# Patient Record
Sex: Male | Born: 1985 | Race: Black or African American | Hispanic: No | Marital: Married | Smoking: Never smoker
Health system: Southern US, Community
[De-identification: ages and names within clinical notes are randomized; demographics above are authoritative.]

## PROBLEM LIST (undated history)

## (undated) DIAGNOSIS — C801 Malignant (primary) neoplasm, unspecified: Secondary | ICD-10-CM

## (undated) DIAGNOSIS — E119 Type 2 diabetes mellitus without complications: Secondary | ICD-10-CM

## (undated) HISTORY — PX: APPENDECTOMY: SHX54

## (undated) HISTORY — PX: TONSILLECTOMY: SUR1361

## (undated) HISTORY — PX: CHOLECYSTECTOMY: SHX55

---

## 2014-09-26 ENCOUNTER — Emergency Department (HOSPITAL_COMMUNITY): Payer: BC Managed Care – PPO

## 2014-09-26 ENCOUNTER — Emergency Department (HOSPITAL_COMMUNITY)
Admission: EM | Admit: 2014-09-26 | Discharge: 2014-09-26 | Disposition: A | Discharge status: Home / Self Care | Payer: BC Managed Care – PPO | Attending: Emergency Medicine | Admitting: Emergency Medicine

## 2014-09-26 ENCOUNTER — Encounter (HOSPITAL_COMMUNITY): Payer: Self-pay | Admitting: Emergency Medicine

## 2014-09-26 DIAGNOSIS — D179 Benign lipomatous neoplasm, unspecified: Secondary | ICD-10-CM | POA: Diagnosis not present

## 2014-09-26 DIAGNOSIS — R2242 Localized swelling, mass and lump, left lower limb: Secondary | ICD-10-CM | POA: Diagnosis not present

## 2014-09-26 DIAGNOSIS — R609 Edema, unspecified: Secondary | ICD-10-CM

## 2014-09-26 LAB — BASIC METABOLIC PANEL
ANION GAP: 9 (ref 5–15)
BUN: 13 mg/dL (ref 6–23)
CALCIUM: 9.4 mg/dL (ref 8.4–10.5)
CO2: 27 mEq/L (ref 19–32)
Chloride: 104 mEq/L (ref 96–112)
Creatinine, Ser: 1.04 mg/dL (ref 0.50–1.35)
GFR calc Af Amer: >90 mL/min (ref >90)
GFR calc non Af Amer: >90 mL/min (ref >90)
GLUCOSE: 100 mg/dL — AB (ref 70–99)
Potassium: 4.2 mEq/L (ref 3.7–5.3)
Sodium: 140 mEq/L (ref 137–147)

## 2014-09-26 LAB — CBC
HCT: 44.2 % (ref 39.0–52.0)
Hemoglobin: 15.3 g/dL (ref 13.0–17.0)
MCH: 28.3 pg (ref 26.0–34.0)
MCHC: 34.6 g/dL (ref 30.0–36.0)
MCV: 81.7 fL (ref 78.0–100.0)
PLATELETS: ADEQUATE 10*3/uL (ref 150–400)
RBC: 5.41 MIL/uL (ref 4.22–5.81)
RDW: 13.1 % (ref 11.5–15.5)
WBC: 3.6 10*3/uL — ABNORMAL LOW (ref 4.0–10.5)

## 2014-09-26 MED ORDER — IOHEXOL 300 MG/ML  SOLN
100.0000 mL | Freq: Once | INTRAMUSCULAR | Status: AC | PRN
  Administered 2014-09-26: 100 mL via INTRAVENOUS

## 2014-09-26 MED ORDER — OXYCODONE-ACETAMINOPHEN 5-325 MG PO TABS
1.0000 | ORAL_TABLET | Freq: Once | ORAL | Status: AC
  Administered 2014-09-26: 1 via ORAL
  Filled 2014-09-26: qty 1

## 2014-09-26 MED ORDER — OXYCODONE-ACETAMINOPHEN 5-325 MG PO TABS: 1.0000 | ORAL_TABLET | Freq: Three times a day (TID) | ORAL | Status: DC | PRN

## 2014-09-26 NOTE — Discharge Instructions (Signed)
Lipoma  A lipoma is a noncancerous (benign) tumor composed of fat cells. They are usually found under the skin (subcutaneous). A lipoma may occur in any tissue of the body that contains fat. Common areas for lipomas to appear include the back, shoulders, buttocks, and thighs. Lipomas are a very common soft tissue growth. They are soft and grow slowly. Most problems caused by a lipoma depend on where it is growing.  DIAGNOSIS   A lipoma can be diagnosed with a physical exam. These tumors rarely become cancerous, but radiographic studies can help determine this for certain. Studies used may include:  · Computerized X-ray scans (CT or CAT scan).  · Computerized magnetic scans (MRI).  TREATMENT   Small lipomas that are not causing problems may be watched. If a lipoma continues to enlarge or causes problems, removal is often the best treatment. Lipomas can also be removed to improve appearance. Surgery is done to remove the fatty cells and the surrounding capsule. Most often, this is done with medicine that numbs the area (local anesthetic). The removed tissue is examined under a microscope to make sure it is not cancerous. Keep all follow-up appointments with your caregiver.  SEEK MEDICAL CARE IF:   · The lipoma becomes larger or hard.  · The lipoma becomes painful, red, or increasingly swollen. These could be signs of infection or a more serious condition.  Document Released: 10/03/2002 Document Revised: 01/05/2012 Document Reviewed: 03/15/2010  ExitCare® Patient Information ©2015 [employer]. This information is not intended to replace advice given to you by your health care provider. Make sure you discuss any questions you have with your health care provider.

## 2014-09-26 NOTE — ED Provider Notes (Signed)
CSN: [id number]     Arrival date & time 09/26/14  7062 History   First MD Initiated Contact with Patient 09/26/14 [phone]     Chief Complaint  Patient presents with  . Abscess     (Consider location/radiation/quality/duration/timing/severity/associated sxs/prior Treatment) HPI Comments: Pt comes in with complaint of increased swelling to his left upper thigh that has been going on for 4 months. Denies redness or warmth. States that the area is getting bigger and now it hurts to walk. States that he was seen at [facility] a couple of months ago but her can't remember what they told him it was.   The history is provided by the patient. No language interpreter was used.    History reviewed. No pertinent past medical history. Past Surgical History  Procedure Laterality Date  . Cholecystectomy    . Appendectomy     History reviewed. No pertinent family history. History  Substance Use Topics  . Smoking status: Never Smoker   . Smokeless tobacco: Not on file  . Alcohol Use: No    Review of Systems  All other systems reviewed and are negative.     Allergies  Review of patient's allergies indicates no known allergies.  Home Medications   Prior to Admission medications   Not on File   BP 136/93 mmHg  Pulse 60  Temp(Src) 97.9 F (36.6 C) (Oral)  Resp 18  Ht 5\' 11"  (1.803 m)  Wt 244 lb (110.678 kg)  BMI 34.05 kg/m2  SpO2 100% Physical Exam  Constitutional: He is oriented to person, place, and time. He appears well-developed and well-nourished.  Cardiovascular: Normal rate and regular rhythm.   Pulmonary/Chest: Effort normal and breath sounds normal.  Musculoskeletal:  Larger area of fullness noted to the left upper thigh. Pt has full rom:no lymphadenopathy noted.   Neurological: He is alert and oriented to person, place, and time.  Skin: Skin is warm and dry.  Nursing note and vitals reviewed.   ED Course  Procedures (including critical care time) Labs Review Labs  Reviewed - No data to display  Imaging Review Dg Hip Complete Left  09/26/2014   CLINICAL DATA:  Left hip swelling for about 4 months, pain, no known injury  EXAM: LEFT HIP - COMPLETE 2+ VIEW  COMPARISON:  None.  FINDINGS: Three views of the left hip submitted. No acute fracture or subluxation. Bilateral hip joints are symmetrical in appearance. Bilateral SI joints are unremarkable.  IMPRESSION: Negative.   Electronically Signed   By: Lahoma Crocker M.D.   On: 09/26/2014 09:48   Ct Hip Left W Contrast  09/26/2014   CLINICAL DATA:  Focal swelling in the left hip starting 4 months ago, enlarging and painful.  EXAM: CT OF THE LEFT HIP WITH CONTRAST  TECHNIQUE: Multidetector CT imaging was performed following the standard protocol during bolus administration of intravenous contrast.  CONTRAST:  113mL OMNIPAQUE IOHEXOL 300 MG/ML  SOLN  COMPARISON:  09/26/2014  FINDINGS: Fatty mass of the left tensor fascia lata muscle, 6.3 by 4.0 by 10.1 cm, with faint stranding in the inferior component of the mass. The adjacent tensor fascia lata demonstrates low-level stranding adjacent to its gluteal margin.  No regional fluid collection identified. Small left inguinal lymph nodes are present. The No pathologic regional adenopathy. Mild degenerative findings/erosions along the iliac side of the left sacroiliac joint.  IMPRESSION: 1. The dominant finding is a lipoma measuring up to 10.1 cm in length in the tensor fascia lata muscle on the  left. This has some faint stranding inferiorly which is probably inflammatory. Given the patient' s age the possibility of liposarcoma is considered very low, and there is no overt were some nodularity in this lesion. If it is causing symptoms than resection may be warranted. 2. Slight thickening of the left tensor fascia lata posteriorly. 3. Several small erosions or degenerative subcortical cystic lesions along the iliac side of the left sacroiliac joint. These are best appreciated on image 62  of series 4.   Electronically Signed   By: Sherryl Barters M.D.   On: 09/26/2014 11:12     EKG Interpretation None      MDM   Final diagnoses:  Swelling  Lipoma    Discussed findings with pt. Pt given percocet for pain. Given referral for ortho and general surgery for possible removal    Glendell Docker, NP 09/26/14 [place], MD 09/26/14 [phone]

## 2014-09-26 NOTE — ED Notes (Signed)
Pt reports "abscess" to left upper thigh x3-4 months. Pt reports site has gotten progressively bigger recently. nad noted.

## 2014-10-05 ENCOUNTER — Encounter: Payer: Self-pay | Admitting: Orthopedic Surgery

## 2014-10-05 ENCOUNTER — Ambulatory Visit: Payer: BC Managed Care – PPO | Admitting: Orthopedic Surgery

## 2014-10-10 ENCOUNTER — Telehealth: Payer: Self-pay | Admitting: Orthopedic Surgery

## 2014-10-10 NOTE — Telephone Encounter (Signed)
TOMORROW

## 2014-10-10 NOTE — Telephone Encounter (Signed)
Patient has called back to re-schedule missed appointment on 10/05/14, [facility] follow up, for problem of Lipoma, Left hip; Xray had been done 09/26/14 at [facility].  Patient relates that size has increased.  Please review and advise regarding scheduling, based on current schedule.  His ph# [phone]

## 2014-10-10 NOTE — Telephone Encounter (Signed)
Called back to patient and scheduled for tomorrow per Dr Ruthe Mannan reply.

## 2014-10-11 ENCOUNTER — Telehealth: Payer: Self-pay | Admitting: Orthopedic Surgery

## 2014-10-11 ENCOUNTER — Encounter: Payer: Self-pay | Admitting: Orthopedic Surgery

## 2014-10-11 ENCOUNTER — Ambulatory Visit (INDEPENDENT_AMBULATORY_CARE_PROVIDER_SITE_OTHER): Payer: BC Managed Care – PPO | Admitting: Orthopedic Surgery

## 2014-10-11 VITALS — BP 115/74 | Ht 71.0 in | Wt 244.0 lb

## 2014-10-11 DIAGNOSIS — D1720 Benign lipomatous neoplasm of skin and subcutaneous tissue of unspecified limb: Secondary | ICD-10-CM | POA: Insufficient documentation

## 2014-10-11 NOTE — Patient Instructions (Signed)
Surgery 10/13/14 mass removal left hip

## 2014-10-11 NOTE — Progress Notes (Signed)
Patient ID: Jason Stone, male   DOB: 04/22/1986, 28 y.o.   MRN: [id number]  Chief Complaint  Patient presents with  . Hip Problem    er follow up, left hip pain with lipoma, no known injury    HPI Jason Stone is a 28 y.o. male.  HPI this patient gives a strong family history of multiple members having cancer including a 30-year-old brother who was recently treated for brain cancer and passed away also has other first-degree relatives with cancer history. He had a cholecystectomy for gallstone disease and appendectomy in August 2015 was in his normal state of health and then started having pain over a mass in the left anterior lateral thigh which was benign and asymptomatic initially and found on incidental CAT scan for other abdominal problems which have since resolved. However the mass approximately 2 months goes about 5 cm and then became painful and upon further CT scan evaluation had grown to 10 cm. It is now interfering with his work as a. He now complains of sharp dull throbbing pain radiating in his upper thigh with associated tingling in the anterolateral nerve. The pain is constant elevated by walking.  No past medical history on file.  Past Surgical History  Procedure Laterality Date  . Cholecystectomy    . Appendectomy      Family History  Problem Relation Age of Onset  . Cancer - Lung    . Cancer - Other      Social History History  Substance Use Topics  . Smoking status: Never Smoker   . Smokeless tobacco: Not on file  . Alcohol Use: No    No Known Allergies  Current Outpatient Prescriptions  Medication Sig Dispense Refill  . acetaminophen (TYLENOL) 500 MG tablet Take 500 mg by mouth every 6 (six) hours as needed.    . oxyCODONE-acetaminophen (PERCOCET/ROXICET) 5-325 MG per tablet Take 1-2 tablets by mouth every 8 (eight) hours as needed for moderate pain or severe pain. (Patient not taking: Reported on 10/11/2014) 10 tablet 0   No current  facility-administered medications for this visit.    Review of Systems Review of Systems All systems are negative except for the left thigh pain which she describes as joint pain but is really related to the mass Blood pressure 115/74, height 5' 11" (1.803 m), weight 244 lb (110.678 kg).  Physical Exam Physical Exam BP 115/74 mmHg  Ht 5' 11" (1.803 m)  Wt 244 lb (110.678 kg)  BMI 34.05 kg/m2 This gentleman is well-developed and well-nourished grooming is normal his hygiene is good he is oriented 3 his mood is pleasant his affect is flat he is ambulating with a limp favoring his left leg  He has a palpable mass which measures 10 x 6 cm and is tender appears to have a rubbery consistency. It does not affect his hip range of motion his hip remained stable muscle tone is normal no atrophy is noted no lymph nodes are palpable in the groin his skin is intact without redness he has a good distal pulse and normal reflexes. No sensory abnormalities are detected  Right leg is normal  Upper extremities are normal   Data Reviewed X-rays are negative and CT scan results are as follows  TECHNIQUE: Multidetector CT imaging was performed following the standard protocol during bolus administration of intravenous contrast.   CONTRAST:  100mL OMNIPAQUE IOHEXOL 300 MG/ML  SOLN   COMPARISON:  09/26/2014   FINDINGS: Fatty mass of the left tensor fascia   lata muscle, 6.3 by 4.0 by 10.1 cm, with faint stranding in the inferior component of the mass. The adjacent tensor fascia lata demonstrates low-level stranding adjacent to its gluteal margin.   No regional fluid collection identified. Small left inguinal lymph nodes are present. The No pathologic regional adenopathy. Mild degenerative findings/erosions along the iliac side of the left sacroiliac joint.   IMPRESSION: 1. The dominant finding is a lipoma measuring up to 10.1 cm in length in the tensor fascia lata muscle on the left. This has  some faint stranding inferiorly which is probably inflammatory. Given the patient' s age the possibility of liposarcoma is considered very low, and there is no overt were some nodularity in this lesion. If it is causing symptoms than resection may be warranted. 2. Slight thickening of the left tensor fascia lata posteriorly. 3. Several small erosions or degenerative subcortical cystic lesions along the iliac side of the left sacroiliac joint. These are best appreciated on image 62 of series 4.     Electronically Signed   By: Walt  Liebkemann M.D.   On: 09/26/2014 11:12   Assessment    Mass left thigh most likely lipoma    Plan    Excision of mass left thigh, will be sent to pathology for definitive diagnosis. Risks and benefits of surgery versus no surgery explained to the patient. The patient is anxious to have this removed because of his strong family history of cancer         Jada Fass 10/11/2014, 10:58 AM    

## 2014-10-11 NOTE — Telephone Encounter (Addendum)
Regarding out-patient surgery scheduled at [facility] (per today's office visit, 10/11/14) for date: 10/13/14, CPT 27327, contacted [place] at ph# [phone]. Per Arlana Pouch, no pre-authorization required and no clinical review required.  Her name, today's date 10/11/14, 3:46p.m.

## 2014-10-12 ENCOUNTER — Encounter (HOSPITAL_COMMUNITY): Payer: Self-pay

## 2014-10-12 ENCOUNTER — Encounter (HOSPITAL_COMMUNITY)
Admission: RE | Admit: 2014-10-12 | Discharge: 2014-10-12 | Disposition: A | Discharge status: Home / Self Care | Payer: BC Managed Care – PPO | Source: Ambulatory Visit | Attending: Orthopedic Surgery | Admitting: Orthopedic Surgery

## 2014-10-12 NOTE — Patient Instructions (Signed)
    Jason Stone  10/12/2014   Your procedure is scheduled on:   10/13/2014  Report to [facility] at  10  AM.  Call this number if you have problems the morning of surgery: [phone]   Remember:   Do not eat food or drink liquids after midnight.   Take these medicines the morning of surgery with A SIP OF WATER:  percocet   Do not wear jewelry, make-up or nail polish.  Do not wear lotions, powders, or perfumes.   Do not shave 48 hours prior to surgery. Men may shave face and neck.  Do not bring valuables to the hospital.  [facility] is not responsible for any belongings or valuables.               Contacts, dentures or bridgework may not be worn into surgery.  Leave suitcase in the car. After surgery it may be brought to your room.  For patients admitted to the hospital, discharge time is determined by your treatment team.               Patients discharged the day of surgery will not be allowed to drive home.  Name and phone number of your driver: family  Special Instructions: Shower using CHG 2 nights before surgery and the night before surgery.  If you shower the day of surgery use CHG.  Use special wash - you have one bottle of CHG for all showers.  You should use approximately 1/3 of the bottle for each shower.   Please read over the following fact sheets that you were given: Pain Booklet, Coughing and Deep Breathing, Surgical Site Infection Prevention, Anesthesia Post-op Instructions and Care and Recovery After Surgery PATIENT INSTRUCTIONS POST-ANESTHESIA  IMMEDIATELY FOLLOWING SURGERY:  Do not drive or operate machinery for the first twenty four hours after surgery.  Do not make any important decisions for twenty four hours after surgery or while taking narcotic pain medications or sedatives.  If you develop intractable nausea and vomiting or a severe headache please notify your doctor immediately.  FOLLOW-UP:  Please make an appointment with your surgeon as instructed. You  do not need to follow up with anesthesia unless specifically instructed to do so.  WOUND CARE INSTRUCTIONS (if applicable):  Keep a dry clean dressing on the anesthesia/puncture wound site if there is drainage.  Once the wound has quit draining you may leave it open to air.  Generally you should leave the bandage intact for twenty four hours unless there is drainage.  If the epidural site drains for more than 36-48 hours please call the anesthesia department.  QUESTIONS?:  Please feel free to call your physician or the hospital operator if you have any questions, and they will be happy to assist you.

## 2014-10-13 ENCOUNTER — Ambulatory Visit (HOSPITAL_COMMUNITY): Payer: BC Managed Care – PPO | Admitting: Anesthesiology

## 2014-10-13 ENCOUNTER — Ambulatory Visit (HOSPITAL_COMMUNITY)
Admission: RE | Admit: 2014-10-13 | Discharge: 2014-10-13 | Disposition: A | Discharge status: Home / Self Care | Payer: BC Managed Care – PPO | Source: Ambulatory Visit | Attending: Orthopedic Surgery | Admitting: Orthopedic Surgery

## 2014-10-13 ENCOUNTER — Encounter (HOSPITAL_COMMUNITY): Payer: Self-pay | Admitting: *Deleted

## 2014-10-13 ENCOUNTER — Encounter (HOSPITAL_COMMUNITY)
Admission: RE | Disposition: A | Discharge status: Home / Self Care | Payer: Self-pay | Source: Ambulatory Visit | Attending: Orthopedic Surgery

## 2014-10-13 DIAGNOSIS — Z808 Family history of malignant neoplasm of other organs or systems: Secondary | ICD-10-CM | POA: Diagnosis not present

## 2014-10-13 DIAGNOSIS — D1724 Benign lipomatous neoplasm of skin and subcutaneous tissue of left leg: Secondary | ICD-10-CM | POA: Diagnosis present

## 2014-10-13 DIAGNOSIS — D179 Benign lipomatous neoplasm, unspecified: Secondary | ICD-10-CM | POA: Insufficient documentation

## 2014-10-13 DIAGNOSIS — Z9049 Acquired absence of other specified parts of digestive tract: Secondary | ICD-10-CM | POA: Insufficient documentation

## 2014-10-13 DIAGNOSIS — D1720 Benign lipomatous neoplasm of skin and subcutaneous tissue of unspecified limb: Secondary | ICD-10-CM

## 2014-10-13 DIAGNOSIS — Z801 Family history of malignant neoplasm of trachea, bronchus and lung: Secondary | ICD-10-CM | POA: Diagnosis not present

## 2014-10-13 DIAGNOSIS — C7652 Malignant neoplasm of left lower limb: Secondary | ICD-10-CM | POA: Insufficient documentation

## 2014-10-13 HISTORY — PX: MASS EXCISION: SHX2000

## 2014-10-13 SURGERY — EXCISION MASS
Anesthesia: General | Site: Thigh | Laterality: Left

## 2014-10-13 MED ORDER — ONDANSETRON HCL 4 MG/2ML IJ SOLN
INTRAMUSCULAR | Status: AC
  Filled 2014-10-13: qty 2

## 2014-10-13 MED ORDER — HYDROMORPHONE HCL 1 MG/ML IJ SOLN
INTRAMUSCULAR | Status: AC
  Filled 2014-10-13: qty 1

## 2014-10-13 MED ORDER — CEFAZOLIN SODIUM-DEXTROSE 2-3 GM-% IV SOLR
2.0000 g | INTRAVENOUS | Status: AC
  Administered 2014-10-13: 2 g via INTRAVENOUS

## 2014-10-13 MED ORDER — CHLORHEXIDINE GLUCONATE 4 % EX LIQD: 60.0000 mL | Freq: Once | CUTANEOUS | Status: DC

## 2014-10-13 MED ORDER — OXYCODONE-ACETAMINOPHEN 5-325 MG PO TABS: 1.0000 | ORAL_TABLET | Freq: Three times a day (TID) | ORAL | Status: DC | PRN

## 2014-10-13 MED ORDER — FENTANYL CITRATE 0.05 MG/ML IJ SOLN
INTRAMUSCULAR | Status: AC
  Filled 2014-10-13: qty 2

## 2014-10-13 MED ORDER — PROPOFOL 10 MG/ML IV BOLUS
INTRAVENOUS | Status: AC
  Filled 2014-10-13: qty 20

## 2014-10-13 MED ORDER — MIDAZOLAM HCL 2 MG/2ML IJ SOLN
INTRAMUSCULAR | Status: AC
  Filled 2014-10-13: qty 2

## 2014-10-13 MED ORDER — FENTANYL CITRATE 0.05 MG/ML IJ SOLN
INTRAMUSCULAR | Status: DC | PRN
  Administered 2014-10-13 (×2): 50 ug via INTRAVENOUS
  Administered 2014-10-13: 25 ug via INTRAVENOUS
  Administered 2014-10-13: 50 ug via INTRAVENOUS
  Administered 2014-10-13: 25 ug via INTRAVENOUS

## 2014-10-13 MED ORDER — 0.9 % SODIUM CHLORIDE (POUR BTL) OPTIME
TOPICAL | Status: DC | PRN
  Administered 2014-10-13: 1000 mL

## 2014-10-13 MED ORDER — BUPIVACAINE-EPINEPHRINE (PF) 0.5% -1:200000 IJ SOLN
INTRAMUSCULAR | Status: AC
  Filled 2014-10-13: qty 30

## 2014-10-13 MED ORDER — LACTATED RINGERS IV SOLN
INTRAVENOUS | Status: DC
  Administered 2014-10-13: 1000 mL via INTRAVENOUS
  Administered 2014-10-13: 09:00:00 via INTRAVENOUS

## 2014-10-13 MED ORDER — FENTANYL CITRATE 0.05 MG/ML IJ SOLN
25.0000 ug | INTRAMUSCULAR | Status: DC | PRN
  Administered 2014-10-13 (×4): 50 ug via INTRAVENOUS

## 2014-10-13 MED ORDER — MIDAZOLAM HCL 2 MG/2ML IJ SOLN
1.0000 mg | INTRAMUSCULAR | Status: DC | PRN
  Administered 2014-10-13 (×2): 2 mg via INTRAVENOUS

## 2014-10-13 MED ORDER — HYDROMORPHONE HCL 1 MG/ML IJ SOLN
1.0000 mg | INTRAMUSCULAR | Status: AC
Start: 2014-10-13 — End: 2014-10-13
  Administered 2014-10-13 (×2): 1 mg via INTRAVENOUS
  Filled 2014-10-13: qty 1

## 2014-10-13 MED ORDER — ONDANSETRON HCL 4 MG/2ML IJ SOLN
4.0000 mg | Freq: Once | INTRAMUSCULAR | Status: AC | PRN
  Administered 2014-10-13: 4 mg via INTRAVENOUS
  Filled 2014-10-13: qty 2

## 2014-10-13 MED ORDER — CEFAZOLIN SODIUM-DEXTROSE 2-3 GM-% IV SOLR
INTRAVENOUS | Status: AC
  Filled 2014-10-13: qty 50

## 2014-10-13 MED ORDER — DEXAMETHASONE SODIUM PHOSPHATE 4 MG/ML IJ SOLN
4.0000 mg | Freq: Once | INTRAMUSCULAR | Status: AC
  Administered 2014-10-13: 4 mg via INTRAVENOUS

## 2014-10-13 MED ORDER — BUPIVACAINE-EPINEPHRINE 0.5% -1:200000 IJ SOLN
INTRAMUSCULAR | Status: DC | PRN
  Administered 2014-10-13: 30 mL

## 2014-10-13 MED ORDER — PROPOFOL 10 MG/ML IV BOLUS
INTRAVENOUS | Status: DC | PRN
  Administered 2014-10-13: 40 mg via INTRAVENOUS
  Administered 2014-10-13: 200 mg via INTRAVENOUS

## 2014-10-13 MED ORDER — DEXAMETHASONE SODIUM PHOSPHATE 4 MG/ML IJ SOLN
INTRAMUSCULAR | Status: AC
  Filled 2014-10-13: qty 1

## 2014-10-13 MED ORDER — ONDANSETRON HCL 4 MG/2ML IJ SOLN
4.0000 mg | Freq: Once | INTRAMUSCULAR | Status: AC
  Administered 2014-10-13: 4 mg via INTRAVENOUS

## 2014-10-13 MED ORDER — FENTANYL CITRATE 0.05 MG/ML IJ SOLN
25.0000 ug | INTRAMUSCULAR | Status: AC
  Administered 2014-10-13 (×2): 25 ug via INTRAVENOUS

## 2014-10-13 MED ORDER — LIDOCAINE HCL (CARDIAC) 10 MG/ML IV SOLN
INTRAVENOUS | Status: DC | PRN
  Administered 2014-10-13: 20 mg via INTRAVENOUS

## 2014-10-13 SURGICAL SUPPLY — 56 items
BAG HAMPER (MISCELLANEOUS) ×3 IMPLANT
BANDAGE ELASTIC 3 VELCRO ST LF (GAUZE/BANDAGES/DRESSINGS) IMPLANT
BANDAGE ELASTIC 6 VELCRO ST LF (GAUZE/BANDAGES/DRESSINGS) IMPLANT
BANDAGE ESMARK 4X12 BL STRL LF (DISPOSABLE) IMPLANT
BLADE SURG 15 STRL LF DISP TIS (BLADE) IMPLANT
BLADE SURG 15 STRL SS (BLADE)
BLADE SURG SZ10 CARB STEEL (BLADE) ×3 IMPLANT
BNDG COHESIVE 4X5 TAN STRL (GAUZE/BANDAGES/DRESSINGS) IMPLANT
BNDG ESMARK 4X12 BLUE STRL LF (DISPOSABLE)
BNDG GAUZE ELAST 4 BULKY (GAUZE/BANDAGES/DRESSINGS) IMPLANT
CHLORAPREP W/TINT 26ML (MISCELLANEOUS) ×3 IMPLANT
CLOSURE WOUND 1/2 X4 (GAUZE/BANDAGES/DRESSINGS)
CLOTH BEACON ORANGE TIMEOUT ST (SAFETY) ×3 IMPLANT
COVER LIGHT HANDLE STERIS (MISCELLANEOUS) ×6 IMPLANT
CUFF TOURNIQUET SINGLE 18IN (TOURNIQUET CUFF) IMPLANT
DECANTER SPIKE VIAL GLASS SM (MISCELLANEOUS) ×3 IMPLANT
DRAPE ORTHO 2.5IN SPLIT 77X108 (DRAPES) ×2 IMPLANT
DRAPE ORTHO SPLIT 77X108 STRL (DRAPES) ×4
DRAPE PROXIMA HALF (DRAPES) ×3 IMPLANT
DRESSING MEPILEX BORDER 6X8 (GAUZE/BANDAGES/DRESSINGS) ×1 IMPLANT
DRSG MEPILEX BORDER 4X12 (GAUZE/BANDAGES/DRESSINGS) ×3 IMPLANT
DRSG MEPILEX BORDER 6X8 (GAUZE/BANDAGES/DRESSINGS) ×3
DRSG XEROFORM 1X8 (GAUZE/BANDAGES/DRESSINGS) IMPLANT
ELECT NEEDLE TIP 2.8 STRL (NEEDLE) IMPLANT
ELECT REM PT RETURN 9FT ADLT (ELECTROSURGICAL) ×3
ELECTRODE REM PT RTRN 9FT ADLT (ELECTROSURGICAL) ×1 IMPLANT
FORMALIN 10 PREFIL 120ML (MISCELLANEOUS) IMPLANT
GAUZE SPONGE 4X4 12PLY STRL (GAUZE/BANDAGES/DRESSINGS) IMPLANT
GLOVE SKINSENSE NS SZ7.0 (GLOVE) ×4
GLOVE SKINSENSE NS SZ8.0 LF (GLOVE) ×2
GLOVE SKINSENSE STRL SZ7.0 (GLOVE) ×2 IMPLANT
GLOVE SKINSENSE STRL SZ8.0 LF (GLOVE) ×1 IMPLANT
GLOVE SS N UNI LF 8.5 STRL (GLOVE) ×3 IMPLANT
GOWN STRL REUS W/TWL LRG LVL3 (GOWN DISPOSABLE) ×6 IMPLANT
GOWN STRL REUS W/TWL XL LVL3 (GOWN DISPOSABLE) ×3 IMPLANT
HAND ALUMI LG (SOFTGOODS) IMPLANT
HAND ALUMI XLG (SOFTGOODS) IMPLANT
INST SET MINOR BONE (KITS) IMPLANT
KIT ROOM TURNOVER APOR (KITS) ×3 IMPLANT
MANIFOLD NEPTUNE II (INSTRUMENTS) ×3 IMPLANT
NEEDLE HYPO 21X1.5 SAFETY (NEEDLE) ×3 IMPLANT
NS IRRIG 1000ML POUR BTL (IV SOLUTION) ×3 IMPLANT
PACK BASIC LIMB (CUSTOM PROCEDURE TRAY) ×3 IMPLANT
PAD ARMBOARD 7.5X6 YLW CONV (MISCELLANEOUS) ×3 IMPLANT
SET BASIN LINEN APH (SET/KITS/TRAYS/PACK) ×3 IMPLANT
STAPLER VISISTAT 35W (STAPLE) ×3 IMPLANT
STRIP CLOSURE SKIN 1/2X4 (GAUZE/BANDAGES/DRESSINGS) IMPLANT
SUT ETHILON 3 0 FSL (SUTURE) IMPLANT
SUT MON AB 2-0 SH 27 (SUTURE) ×4
SUT MON AB 2-0 SH27 (SUTURE) ×2 IMPLANT
SUT PROLENE 3 0 PS 2 (SUTURE) IMPLANT
SUT SILK 2 0 (SUTURE) ×2
SUT SILK 2-0 18XBRD TIE 12 (SUTURE) ×1 IMPLANT
SYR 30ML LL (SYRINGE) ×3 IMPLANT
SYR BULB IRRIGATION 50ML (SYRINGE) ×3 IMPLANT
SYRINGE 10CC LL (SYRINGE) IMPLANT

## 2014-10-13 NOTE — Transfer of Care (Signed)
Immediate Anesthesia Transfer of Care Note  Patient: Jason Stone  Procedure(s) Performed: Procedure(s) (LRB): EXCISION MASS (Left)  Patient Location: PACU  Anesthesia Type: General  Level of Consciousness: awake  Airway & Oxygen Therapy: Patient Spontanous Breathing and non-rebreather face mask  Post-op Assessment: Report given to PACU RN, Post -op Vital signs reviewed and stable and Patient moving all extremities  Post vital signs: Reviewed and stable  Complications: No apparent anesthesia complications

## 2014-10-13 NOTE — Discharge Instructions (Signed)
Incision Care °An incision is when a surgeon cuts into your body tissues. After surgery, the incision needs to be cared for properly to prevent infection.  °HOME CARE INSTRUCTIONS  °· Take all medicine as directed by your caregiver. Only take over-the-counter or prescription medicines for pain, discomfort, or fever as directed by your caregiver. °· Do not remove your bandage (dressing) or get your incision wet until your surgeon gives you permission. In the event that your dressing becomes wet, dirty, or starts to smell, change the dressing and call your surgeon for instructions as soon as possible. °· Take showers. Do not take tub baths, swim, or do anything that may soak the wound until it is healed. °· Resume your normal diet and activities as directed or allowed. °· Avoid lifting any weight until you are instructed otherwise. °· Use anti-itch antihistamine medicine as directed by your caregiver. The wound may itch when it is healing. Do not pick or scratch at the wound. °· Follow up with your caregiver for stitch (suture) or staple removal as directed. °· Drink enough fluids to keep your urine clear or pale yellow. °SEEK MEDICAL CARE IF:  °· You have redness, swelling, or increasing pain in the wound that is not controlled with medicine. °· You have drainage, blood, or pus coming from the wound that lasts longer than 1 day. °· You develop muscle aches, chills, or a general ill feeling. °· You notice a bad smell coming from the wound or dressing. °· Your wound edges separate after the sutures, staples, or skin adhesive strips have been removed. °· You develop persistent nausea or vomiting. °SEEK IMMEDIATE MEDICAL CARE IF:  °· You have a fever. °· You develop a rash. °· You develop dizzy episodes or faint while standing. °· You have difficulty breathing. °· You develop any reaction or side effects to medicine given. °MAKE SURE YOU:  °· Understand these instructions. °· Will watch your condition. °· Will get help  right away if you are not doing well or get worse. °Document Released: 05/02/2005 Document Revised: 01/05/2012 Document Reviewed: 12/07/2013 °ExitCare® Patient Information ©2015 [employer]. This information is not intended to replace advice given to you by your health care provider. Make sure you discuss any questions you have with your health care provider. ° ° °

## 2014-10-13 NOTE — H&P (View-Only) (Signed)
Patient ID: Jason Stone, male   DOB: [birth year], 28 y.o.   MRN: [id number]  Chief Complaint  Patient presents with  . Hip Problem    er follow up, left hip pain with lipoma, no known injury    HPI Jason Stone is a 28 y.o. male.  HPI this patient gives a strong family history of multiple members having cancer including a 26 year old brother who was recently treated for brain cancer and passed away also has other first-degree relatives with cancer history. He had a cholecystectomy for gallstone disease and appendectomy in August 2015 was in his normal state of health and then started having pain over a mass in the left anterior lateral thigh which was benign and asymptomatic initially and found on incidental CAT scan for other abdominal problems which have since resolved. However the mass approximately 2 months goes about 5 cm and then became painful and upon further CT scan evaluation had grown to 10 cm. It is now interfering with his work as a. He now complains of sharp dull throbbing pain radiating in his upper thigh with associated tingling in the anterolateral nerve. The pain is constant elevated by walking.  No past medical history on file.  Past Surgical History  Procedure Laterality Date  . Cholecystectomy    . Appendectomy      Family History  Problem Relation Age of Onset  . Cancer - Lung    . Cancer - Other      Social History History  Substance Use Topics  . Smoking status: Never Smoker   . Smokeless tobacco: Not on file  . Alcohol Use: No    No Known Allergies  Current Outpatient Prescriptions  Medication Sig Dispense Refill  . acetaminophen (TYLENOL) 500 MG tablet Take 500 mg by mouth every 6 (six) hours as needed.    Marland Kitchen oxyCODONE-acetaminophen (PERCOCET/ROXICET) 5-325 MG per tablet Take 1-2 tablets by mouth every 8 (eight) hours as needed for moderate pain or severe pain. (Patient not taking: Reported on 10/11/2014) 10 tablet 0   No current  facility-administered medications for this visit.    Review of Systems Review of Systems All systems are negative except for the left thigh pain which she describes as joint pain but is really related to the mass Blood pressure 115/74, height 5\' 11"  (1.803 m), weight 244 lb (110.678 kg).  Physical Exam Physical Exam BP 115/74 mmHg  Ht 5\' 11"  (1.803 m)  Wt 244 lb (110.678 kg)  BMI 34.05 kg/m2 This gentleman is well-developed and well-nourished grooming is normal his hygiene is good he is oriented 3 his mood is pleasant his affect is flat he is ambulating with a limp favoring his left leg  He has a palpable mass which measures 10 x 6 cm and is tender appears to have a rubbery consistency. It does not affect his hip range of motion his hip remained stable muscle tone is normal no atrophy is noted no lymph nodes are palpable in the groin his skin is intact without redness he has a good distal pulse and normal reflexes. No sensory abnormalities are detected  Right leg is normal  Upper extremities are normal   Data Reviewed X-rays are negative and CT scan results are as follows  TECHNIQUE: Multidetector CT imaging was performed following the standard protocol during bolus administration of intravenous contrast.   CONTRAST:  174mL OMNIPAQUE IOHEXOL 300 MG/ML  SOLN   COMPARISON:  09/26/2014   FINDINGS: Fatty mass of the left tensor fascia  lata muscle, 6.3 by 4.0 by 10.1 cm, with faint stranding in the inferior component of the mass. The adjacent tensor fascia lata demonstrates low-level stranding adjacent to its gluteal margin.   No regional fluid collection identified. Small left inguinal lymph nodes are present. The No pathologic regional adenopathy. Mild degenerative findings/erosions along the iliac side of the left sacroiliac joint.   IMPRESSION: 1. The dominant finding is a lipoma measuring up to 10.1 cm in length in the tensor fascia lata muscle on the left. This has  some faint stranding inferiorly which is probably inflammatory. Given the patient' s age the possibility of liposarcoma is considered very low, and there is no overt were some nodularity in this lesion. If it is causing symptoms than resection may be warranted. 2. Slight thickening of the left tensor fascia lata posteriorly. 3. Several small erosions or degenerative subcortical cystic lesions along the iliac side of the left sacroiliac joint. These are best appreciated on image 62 of series 4.     Electronically Signed   By: Sherryl Barters M.D.   On: 09/26/2014 11:12   Assessment    Mass left thigh most likely lipoma    Plan    Excision of mass left thigh, will be sent to pathology for definitive diagnosis. Risks and benefits of surgery versus no surgery explained to the patient. The patient is anxious to have this removed because of his strong family history of cancer         Arther Abbott 10/11/2014, 10:58 AM

## 2014-10-13 NOTE — Interval H&P Note (Signed)
History and Physical Interval Note:  10/13/2014 9:34 AM  Jason Stone  has presented today for surgery, with the diagnosis of mass left thigh  The various methods of treatment have been discussed with the patient and family. After consideration of risks, benefits and other options for treatment, the patient has consented to  Procedure(s) with comments: EXCISION MASS (Left) - left thigh as a surgical intervention .  The patient's history has been reviewed, patient examined, no change in status, stable for surgery.  I have reviewed the patient's chart and labs.  Questions were answered to the patient's satisfaction.     Arther Abbott

## 2014-10-13 NOTE — Progress Notes (Signed)
Surgery asked to assist in excision of large (>10 cm)  Soft tissue mass left hip area. This was uneventful and all follow up will be as per Ortho.   No insurance will be filed from my point of view.  Thanks.  Verdon Cummins MD

## 2014-10-13 NOTE — Anesthesia Postprocedure Evaluation (Signed)
  Anesthesia Post-op Note  Patient: Jason Stone  Procedure(s) Performed: Procedure(s) with comments: EXCISION MASS (Left) - left thigh  Patient Location: PACU  Anesthesia Type:General  Level of Consciousness: awake and patient cooperative  Airway and Oxygen Therapy: Patient Spontanous Breathing and non-rebreather face mask  Post-op Pain: moderate  Post-op Assessment: Post-op Vital signs reviewed, Patient's Cardiovascular Status Stable, Respiratory Function Stable and Patent Airway  Post-op Vital Signs: Reviewed and stable  Last Vitals:  Filed Vitals:   10/13/14 1100  BP: 132/88  Pulse: 89  Temp: 36.4 C  Resp: 20    Complications: No apparent anesthesia complications

## 2014-10-13 NOTE — Anesthesia Preprocedure Evaluation (Signed)
Anesthesia Evaluation  Patient identified by MRN, date of birth, ID band Patient awake    Reviewed: Allergy & Precautions, H&P , NPO status , Patient's Chart, lab work & pertinent test results  Airway Mallampati: I  TM Distance: >3 FB Neck ROM: Full    Dental  (+) Teeth Intact   Pulmonary neg pulmonary ROS,  breath sounds clear to auscultation        Cardiovascular negative cardio ROS  Rhythm:Regular Rate:Normal     Neuro/Psych    GI/Hepatic negative GI ROS,   Endo/Other    Renal/GU      Musculoskeletal   Abdominal   Peds  Hematology   Anesthesia Other Findings   Reproductive/Obstetrics                             Anesthesia Physical Anesthesia Plan  ASA: I  Anesthesia Plan: General   Post-op Pain Management:    Induction: Intravenous  Airway Management Planned: LMA  Additional Equipment:   Intra-op Plan:   Post-operative Plan: Extubation in OR  Informed Consent: I have reviewed the patients History and Physical, chart, labs and discussed the procedure including the risks, benefits and alternatives for the proposed anesthesia with the patient or authorized representative who has indicated his/her understanding and acceptance.     Plan Discussed with:   Anesthesia Plan Comments:         Anesthesia Quick Evaluation

## 2014-10-13 NOTE — Op Note (Signed)
10/13/2014  10:43 AM  PATIENT:  Jason Stone  28 y.o. male  PRE-OPERATIVE DIAGNOSIS:  mass left thigh  POST-OPERATIVE DIAGNOSIS:  mass left thigh, intramuscular lipoma  PROCEDURE:  Procedure(s) with comments: EXCISION MASS (Left) - left thigh  Details of procedure the mass was intramuscular and encapsulated and consisted of fatty tissue, mass WAS  approximate 10 cm long by 5 cm wide  The patient was identified in the preop holding area by 2 identification markers had been approved by the [facility] system   the mass was marked and confirmed as the surgical site. Chart update was completed. Patient was taken to the operating room given appropriate Ancef based on his weight of 244 pounds. He had general anesthesia. In supine position. After sterile prep and drape timeout was completed.  A longitudinal incision was made over the center of the mass extended proximally and distally subcutaneous tissue was divided and cautery was used to control bleeding. The muscle was split bluntly and the lesion was shelled out with little adhesions or attachments.  Hemostasis was obtained with electrocautery irrigation was performed with Cokes amounts of saline. Wound was closed with 2-0 Monocryl and staples. We injected Marcaine with epinephrine. Sterile dressing was applied. Patient was expecting her room in stable condition.  SURGEON:  Surgeon(s) and Role:    * Carole Civil, MD - Primary    * Scherry Ran, MD - Assisting  PHYSICIAN ASSISTANT:   ASSISTANTS: Scherry Ran    ANESTHESIA:   general  EBL:  Total I/O In: 900 [I.V.:900] Out: 140 [Urine:100; Blood:40]  BLOOD ADMINISTERED:none  DRAINS: none   LOCAL MEDICATIONS USED:  MARCAINE     SPECIMEN:  No Specimen and Source of Specimen:  Left thigh intramuscular tensor fascia   DISPOSITION OF SPECIMEN:  PATHOLOGY  COUNTS:  YES  TOURNIQUET:  * No tourniquets in log *  DICTATION: .Dragon Dictation  PLAN OF CARE:  Discharge to home after PACU  PATIENT DISPOSITION:  PACU - hemodynamically stable.   Delay start of Pharmacological VTE agent (>24hrs) due to surgical blood loss or risk of bleeding: not applicable

## 2014-10-13 NOTE — Brief Op Note (Signed)
10/13/2014  10:43 AM  PATIENT:  Jason Stone  28 y.o. male  PRE-OPERATIVE DIAGNOSIS:  mass left thigh  POST-OPERATIVE DIAGNOSIS:  mass left thigh, intramuscular lipoma  PROCEDURE:  Procedure(s) with comments: EXCISION MASS (Left) - left thigh  Details of procedure the mass was intramuscular and encapsulated and consisted of fatty tissue, mass WAS  approximate 10 cm long by 5 cm wide  The patient was identified in the preop holding area by 2 identification markers had been approved by the [facility]   SURG the mass was marked and confirmed as the surgical site. Chart update was completed. Patient was taken to the operating room given appropriate Ancef based on his weight of 244 pounds. He had general anesthesia. In supine position. After sterile prep and drape timeout was completed.  A longitudinal incision was made over the center of the mass extended proximally and distally subcutaneous tissue was divided and cautery was used to control bleeding. The muscle was split bluntly and the lesion was shelled out with little adhesions or attachments.  Hemostasis was obtained with electrocautery irrigation was performed with Cokes amounts of saline. Wound was closed with 2-0 Monocryl and staples. We injected Marcaine with epinephrine. Sterile dressing was applied. Patient was expecting her room in stable condition.  SURGEON:  Surgeon(s) and Role:    * Carole Civil, MD - Primary    * Scherry Ran, MD - Assisting  PHYSICIAN ASSISTANT:   ASSISTANTS: Scherry Ran    ANESTHESIA:   general  EBL:  Total I/O In: 900 [I.V.:900] Out: 140 [Urine:100; Blood:40]  BLOOD ADMINISTERED:none  DRAINS: none   LOCAL MEDICATIONS USED:  MARCAINE     SPECIMEN:  No Specimen and Source of Specimen:  Left thigh intramuscular tensor fascia   DISPOSITION OF SPECIMEN:  PATHOLOGY  COUNTS:  YES  TOURNIQUET:  * No tourniquets in log *  DICTATION: .Dragon Dictation  PLAN OF  CARE: Discharge to home after PACU  PATIENT DISPOSITION:  PACU - hemodynamically stable.   Delay start of Pharmacological VTE agent (>24hrs) due to surgical blood loss or risk of bleeding: not applicable

## 2014-10-13 NOTE — Anesthesia Procedure Notes (Signed)
Procedure Name: LMA Insertion Date/Time: 10/13/2014 9:58 AM Performed by: Vista Deck Pre-anesthesia Checklist: Patient identified, Patient being monitored, Emergency Drugs available, Timeout performed and Suction available Patient Re-evaluated:Patient Re-evaluated prior to inductionOxygen Delivery Method: Circle System Utilized Preoxygenation: Pre-oxygenation with 100% oxygen Intubation Type: IV induction Ventilation: Mask ventilation without difficulty LMA: LMA inserted LMA Size: 4.0 Number of attempts: 1 Placement Confirmation: positive ETCO2 and breath sounds checked- equal and bilateral Tube secured with: Tape

## 2014-10-16 ENCOUNTER — Ambulatory Visit (INDEPENDENT_AMBULATORY_CARE_PROVIDER_SITE_OTHER): Payer: BC Managed Care – PPO | Admitting: Orthopedic Surgery

## 2014-10-16 ENCOUNTER — Encounter: Payer: Self-pay | Admitting: Orthopedic Surgery

## 2014-10-16 ENCOUNTER — Other Ambulatory Visit: Payer: Self-pay | Admitting: *Deleted

## 2014-10-16 VITALS — BP 141/85 | Ht 71.0 in | Wt 244.0 lb

## 2014-10-16 DIAGNOSIS — D1720 Benign lipomatous neoplasm of skin and subcutaneous tissue of unspecified limb: Secondary | ICD-10-CM

## 2014-10-16 MED ORDER — OXYCODONE-ACETAMINOPHEN 5-325 MG PO TABS: 1.0000 | ORAL_TABLET | Freq: Three times a day (TID) | ORAL | Status: DC | PRN

## 2014-10-16 MED ORDER — IBUPROFEN 800 MG PO TABS: 800.0000 mg | ORAL_TABLET | Freq: Three times a day (TID) | ORAL | Status: DC

## 2014-10-16 NOTE — Progress Notes (Signed)
Patient ID: Jason Stone, male   DOB: 03-Sep-1986, 28 y.o.   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

## 2014-10-17 ENCOUNTER — Encounter (HOSPITAL_COMMUNITY): Payer: Self-pay | Admitting: Orthopedic Surgery

## 2014-10-24 ENCOUNTER — Encounter (HOSPITAL_COMMUNITY): Payer: Self-pay

## 2014-10-26 ENCOUNTER — Encounter: Payer: Self-pay | Admitting: Orthopedic Surgery

## 2014-10-26 ENCOUNTER — Ambulatory Visit (INDEPENDENT_AMBULATORY_CARE_PROVIDER_SITE_OTHER): Payer: BC Managed Care – PPO | Admitting: Orthopedic Surgery

## 2014-10-26 VITALS — BP 139/93 | Ht 71.0 in | Wt 244.0 lb

## 2014-10-26 DIAGNOSIS — IMO0001 Reserved for inherently not codable concepts without codable children: Secondary | ICD-10-CM

## 2014-10-26 DIAGNOSIS — T792XXA Traumatic secondary and recurrent hemorrhage and seroma, initial encounter: Secondary | ICD-10-CM

## 2014-10-26 DIAGNOSIS — D1720 Benign lipomatous neoplasm of skin and subcutaneous tissue of unspecified limb: Secondary | ICD-10-CM

## 2014-10-26 DIAGNOSIS — T888XXA Other specified complications of surgical and medical care, not elsewhere classified, initial encounter: Secondary | ICD-10-CM

## 2014-10-26 NOTE — Progress Notes (Signed)
Postop  Chief Complaint  Patient presents with  . Follow-up    post op 2, left hip staple removal, DOS12/18/15   Encounter Diagnoses  Name Primary?  . Lipoma of lower extremity Yes  . Seroma complicating a procedure, initial encounter      The patient came in for staple removal. He had a large fluid collection at the site of the surgery. We decided to go ahead and aspirate the area and got back about 120 mL of serous bloody fluid  Patient still complaining of numbness and anterior thigh consistent with the removal of this lesion. Primarily burning numbness in the anterior thigh related to the lateral femoral cutaneous nerve probably.  I spoke with the pathologist and the markers for liposarcoma were negative but they confirm that this doesn't mean that there is no sarcomatous lesion it's only a confirmatory test. So I spoke with the patient about this and recommended we send him to [facility] for further definitive care and management  He should come back next week to check the seroma for possible re-aspiration    aspiration left thigh  Indication swelling postop most likely seroma but rule out infection  We aspirated 120 mL of serous bloody fluid with an 18-gauge needle using alcohol for proprioceptive chloride for anesthesia  No complications were noted  Verbal consent was obtained and timeout was completed.

## 2014-10-26 NOTE — Patient Instructions (Signed)
The patient should continue out of work status  He will be referred to [facility] for evaluation of the lipoma    he should also come back in a week to evaluate left leg status post aspiration of seroma

## 2014-11-06 ENCOUNTER — Ambulatory Visit: Payer: BC Managed Care – PPO | Admitting: Orthopedic Surgery

## 2014-11-07 ENCOUNTER — Ambulatory Visit (INDEPENDENT_AMBULATORY_CARE_PROVIDER_SITE_OTHER): Payer: Self-pay | Admitting: Orthopedic Surgery

## 2014-11-07 ENCOUNTER — Encounter: Payer: Self-pay | Admitting: Orthopedic Surgery

## 2014-11-07 VITALS — BP 110/76 | Ht 71.0 in | Wt 244.0 lb

## 2014-11-07 DIAGNOSIS — IMO0001 Reserved for inherently not codable concepts without codable children: Secondary | ICD-10-CM

## 2014-11-07 DIAGNOSIS — T792XXA Traumatic secondary and recurrent hemorrhage and seroma, initial encounter: Secondary | ICD-10-CM

## 2014-11-07 DIAGNOSIS — T888XXA Other specified complications of surgical and medical care, not elsewhere classified, initial encounter: Secondary | ICD-10-CM

## 2014-11-07 DIAGNOSIS — D1720 Benign lipomatous neoplasm of skin and subcutaneous tissue of unspecified limb: Secondary | ICD-10-CM

## 2014-11-07 MED ORDER — GABAPENTIN 100 MG PO CAPS: 100.0000 mg | ORAL_CAPSULE | Freq: Three times a day (TID) | ORAL | Status: DC

## 2014-11-07 NOTE — Patient Instructions (Addendum)
New med sent to pharmacy  Call after appointment at [facility]

## 2014-11-07 NOTE — Progress Notes (Signed)
Patient ID: Jason Stone, male   DOB: [birth year], 29 y.o.   MRN: [id number] Chief Complaint  Patient presents with  . 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 Patient still complains of numbness in the anterior thigh which is most likely related to the lateral femoral cutaneous nerve.  His fluid accumulation was removed last time it was a seroma. He has no recurrent fluid accumulation. He still walking with somewhat of a limp and he still numb on the front of the thigh.  I spoke with the pathology department at [facility] to arrange for his slides to be sent to [facility]  His appointment is for the 19th  He will be out of work continued past January 16 to include his out of work time and we should probably make it for I would say in the February and if necessary longer depending on what he is told at the follow-up visit at [facility].

## 2014-12-20 ENCOUNTER — Encounter: Payer: Self-pay | Admitting: Orthopedic Surgery

## 2014-12-20 ENCOUNTER — Telehealth: Payer: Self-pay | Admitting: Orthopedic Surgery

## 2014-12-20 NOTE — Telephone Encounter (Signed)
Patient called to relay that he "was not able to make it" to the referral appointment at [facility] with Dr Warren Lacy.  He was re-scheduled for appointment 12/25/14, 8:45a.m.  This is the first he has called to relay this information.  States he is doing okay, and wanted to know about returning to work.  His last work note indicated out until 12/19/14 or until further notice.  I relayed that I can update his work note with extended time out of work through 12/25/14 (the date of his appointment with Dr Warren Lacy) for continuity of dates, until he is further advised by Dr Warren Lacy.  I also strongly encouraged patient about the importance of keeping referral appointments.  Patient's ph# is [phone]

## 2014-12-20 NOTE — Telephone Encounter (Signed)
Noted, Patient's missed appointment was scheduled for 11/14/14 @ 1pm, patient was aware of appt date and time and aware to pick up imaging disc from [facility] for appointment. Dr Mylo Red @ phone # [phone], fax # [phone]

## 2014-12-26 ENCOUNTER — Telehealth: Payer: Self-pay | Admitting: Orthopedic Surgery

## 2014-12-26 ENCOUNTER — Ambulatory Visit: Payer: Self-pay | Admitting: Orthopedic Surgery

## 2014-12-26 NOTE — Telephone Encounter (Signed)
Patient called to relay that he did see the specialist at [facility], Dr Sullivan Lone, per appointment 11/2914 that he had re-scheduled, as previously noted.  He state that Dr Warren Lacy ran a "staging" CT scan of chest, which he states showed 2 nodules in his chest; states she will follow up with him regarding this diagnosis; states he may follow back up with Dr Aline Brochure regarding his return to work, status/post surgery 10/13/14.  I offered patient an appointment today, 12/26/14, which he scheduled; then called back to re-schedule for tomorrow, 12/27/14.

## 2014-12-27 ENCOUNTER — Encounter: Payer: Self-pay | Admitting: Orthopedic Surgery

## 2014-12-27 ENCOUNTER — Ambulatory Visit (INDEPENDENT_AMBULATORY_CARE_PROVIDER_SITE_OTHER): Payer: Self-pay | Admitting: Orthopedic Surgery

## 2014-12-27 VITALS — BP 137/81 | Ht 71.0 in | Wt 244.0 lb

## 2014-12-27 DIAGNOSIS — D1720 Benign lipomatous neoplasm of skin and subcutaneous tissue of unspecified limb: Secondary | ICD-10-CM

## 2014-12-27 DIAGNOSIS — T792XXA Traumatic secondary and recurrent hemorrhage and seroma, initial encounter: Secondary | ICD-10-CM

## 2014-12-27 DIAGNOSIS — T888XXA Other specified complications of surgical and medical care, not elsewhere classified, initial encounter: Secondary | ICD-10-CM

## 2014-12-27 DIAGNOSIS — IMO0001 Reserved for inherently not codable concepts without codable children: Secondary | ICD-10-CM

## 2014-12-27 MED ORDER — IBUPROFEN 800 MG PO TABS: 800.0000 mg | ORAL_TABLET | Freq: Three times a day (TID) | ORAL | Status: AC

## 2014-12-27 MED ORDER — ACETAMINOPHEN-CODEINE #3 300-30 MG PO TABS
1.0000 | ORAL_TABLET | Freq: Four times a day (QID) | ORAL | Status: DC | PRN
Start: 2014-12-27 — End: 2022-06-23

## 2014-12-27 NOTE — Patient Instructions (Signed)
Out of work until 12/27/14-01/24/15

## 2014-12-27 NOTE — Progress Notes (Signed)
Chief Complaint  Patient presents with  . 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  The patient went to [facility] for second opinion CT scan showed 2 nodules on his chest which will be evaluated in September  He is currently out of work he is walking better without limp has numbness on the distal thigh 4 cm proximal to the patella and he has numbness along the lateral thigh.  He took Neurontin it did not help his numbness  He is currently on Tylenol regular strength with incomplete pain relief  BP 137/81 mmHg  Ht 5\' 11"  (1.803 m)  Wt 244 lb (110.678 kg)  BMI 34.05 kg/m2   His incision is nontender to collection no redness no tenderness. His knee flexion and extension are normal. He has decreased sensation on the lateral thigh and just proximal to the patella. Nodes are negative in the groin. Ambulation is normal. Distal pulses intact.  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  Ibuprofen 800 mg 3 times a day Tylenol 3 every 4 when necessary pain  Follow-up 3 months

## 2015-02-01 ENCOUNTER — Telehealth: Payer: Self-pay | Admitting: Orthopedic Surgery

## 2015-02-01 NOTE — Telephone Encounter (Signed)
Patient called to relay that he is having fluid 'building up again'; states he does not have a follow up appointment at the cancer specialist at [facility] until September. He is status/post excision mass left thigh 10/13/14; please advise regarding scheduling and/or other recommendation. Ph# [phone]

## 2015-02-01 NOTE — Telephone Encounter (Signed)
Okay to go ahead and schedule him here?

## 2015-02-08 ENCOUNTER — Ambulatory Visit: Payer: BLUE CROSS/BLUE SHIELD | Admitting: Orthopedic Surgery

## 2015-02-22 ENCOUNTER — Telehealth: Payer: Self-pay | Admitting: Orthopedic Surgery

## 2015-02-22 NOTE — Telephone Encounter (Signed)
Patient called regarding (1) appointment he cancelled for 02/08/15 - he states he was unable to get back in touch to re-schedule it; therefore, scheduled 02/26/15.  He relays he has gone to [facility] Emergency Room a total of 3 times since he was last seen in our office 12/27/14, and had Xrays there.  He is aware to request medical records, reports and films to bring to next appointment.  He is also scheduled to see the specialist at [facility] 02/26/15.   (2) He requested an extended out of work note, as "never did return to work as scheduled 01/26/15"; states employer had been working with him, but now that may not be the case.  I relayed that Dr Aline Brochure will need to advise on note. His ph# is [phone]

## 2015-02-26 ENCOUNTER — Ambulatory Visit: Payer: BLUE CROSS/BLUE SHIELD | Admitting: Orthopedic Surgery

## 2015-03-29 ENCOUNTER — Encounter: Payer: Self-pay | Admitting: Orthopedic Surgery

## 2015-03-29 ENCOUNTER — Ambulatory Visit: Payer: BLUE CROSS/BLUE SHIELD | Admitting: Orthopedic Surgery

## 2015-07-15 IMAGING — CR DG HIP (WITH OR WITHOUT PELVIS) 2-3V*L*
3 series · 3 of 3 positions shown · non-contrast
Comparison: None.

CLINICAL DATA: Left hip swelling for about 4 months, pain, no known
injury

EXAM:
LEFT HIP - COMPLETE 2+ VIEW

[view not recorded (1 of 3)]
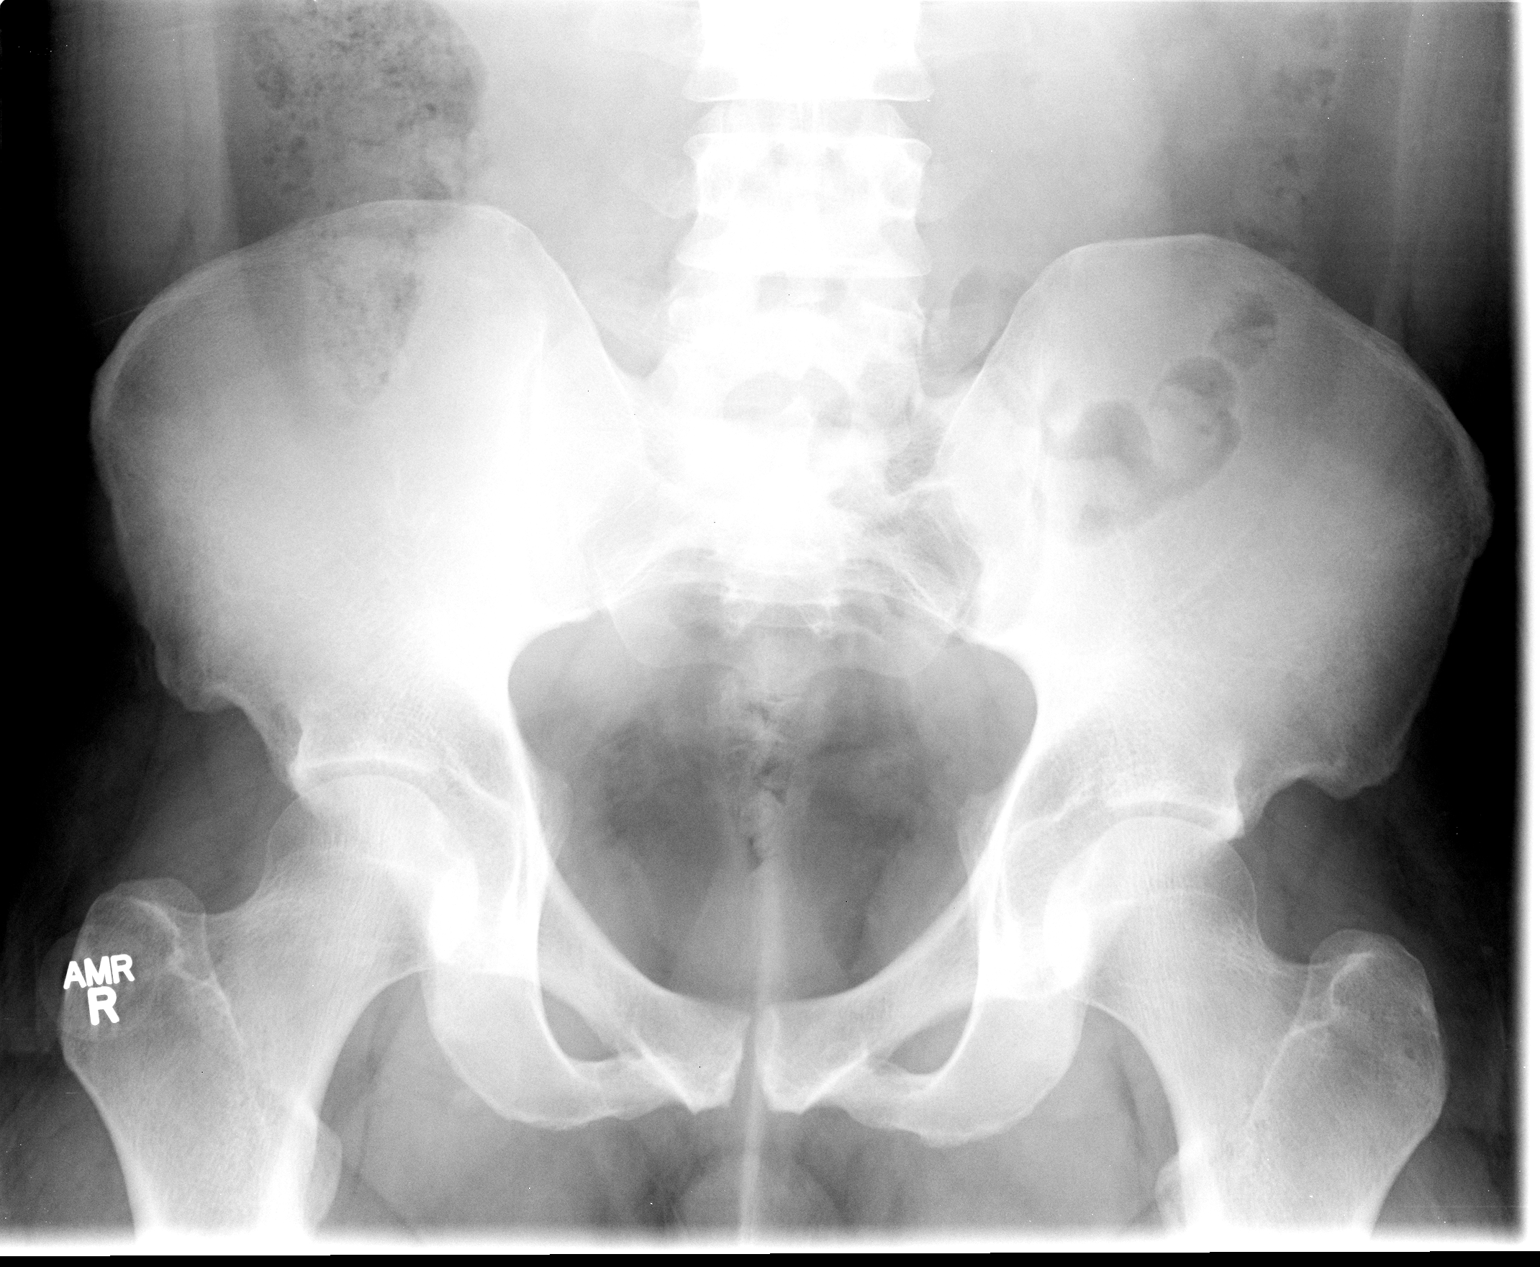

[view not recorded (2 of 3)]
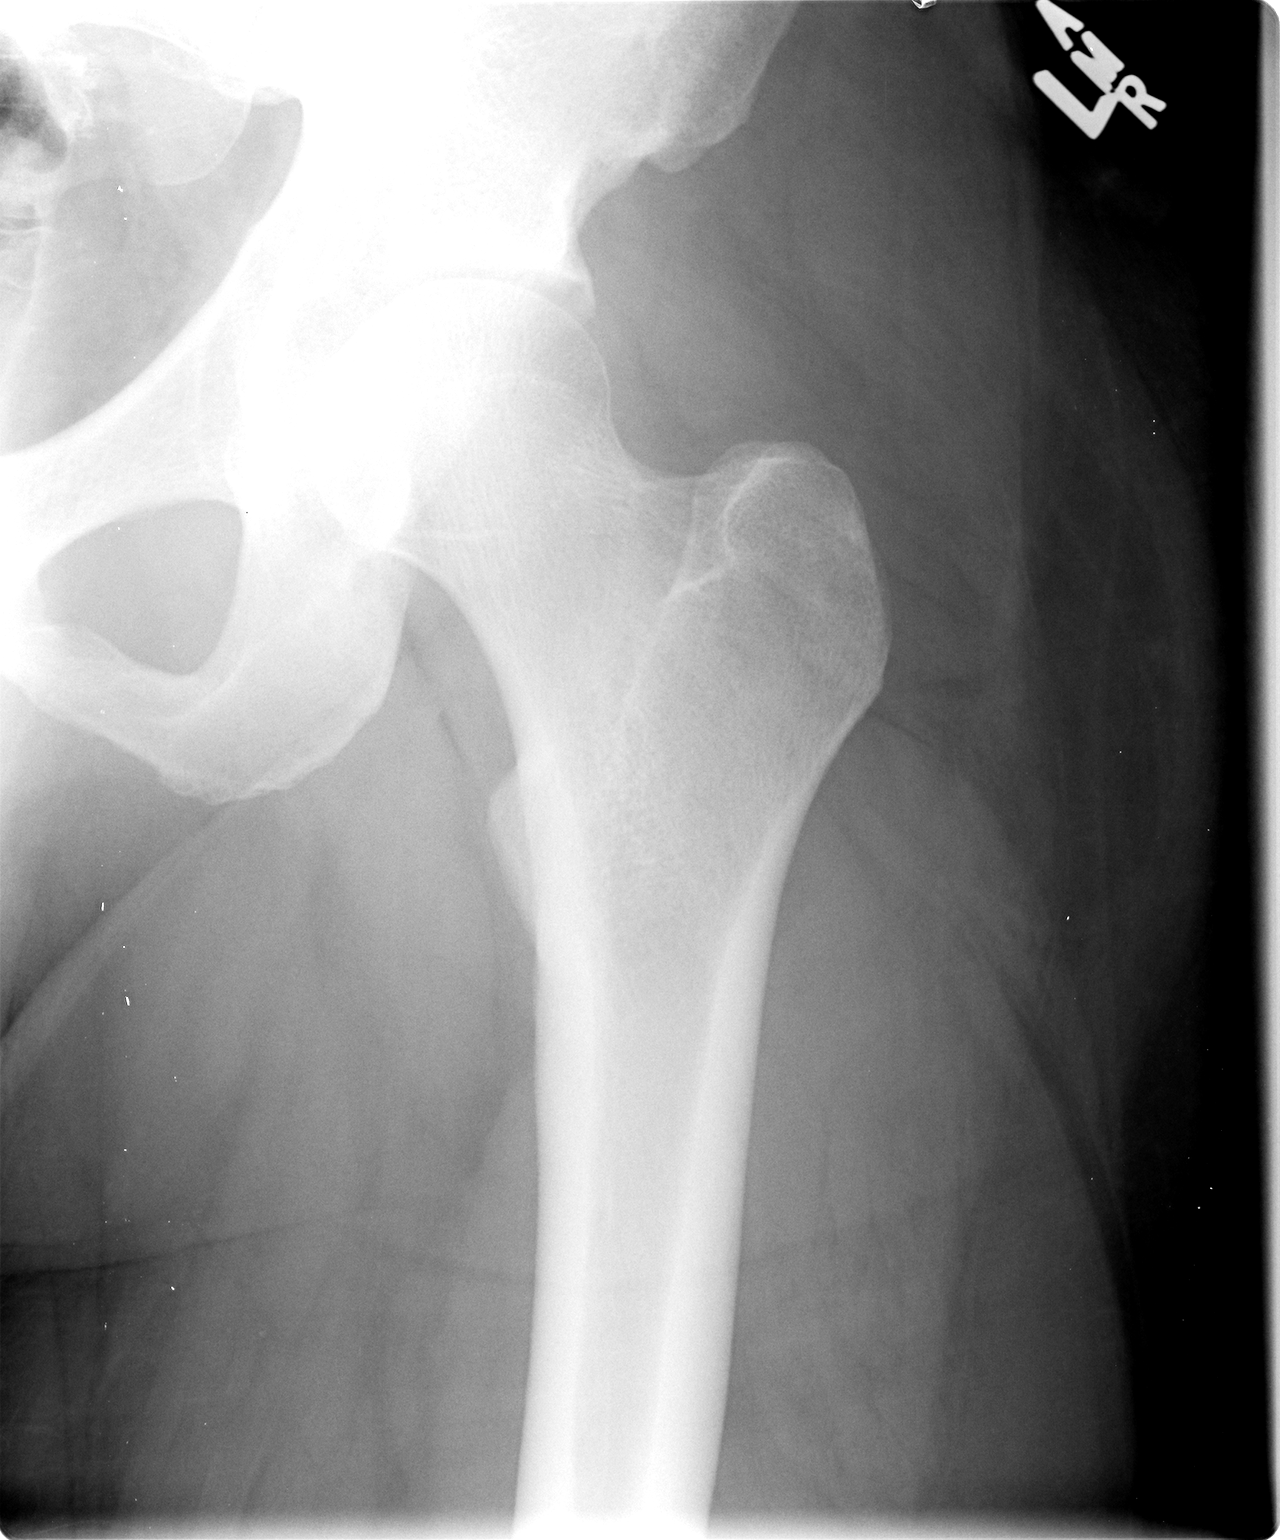

[view not recorded (3 of 3)]
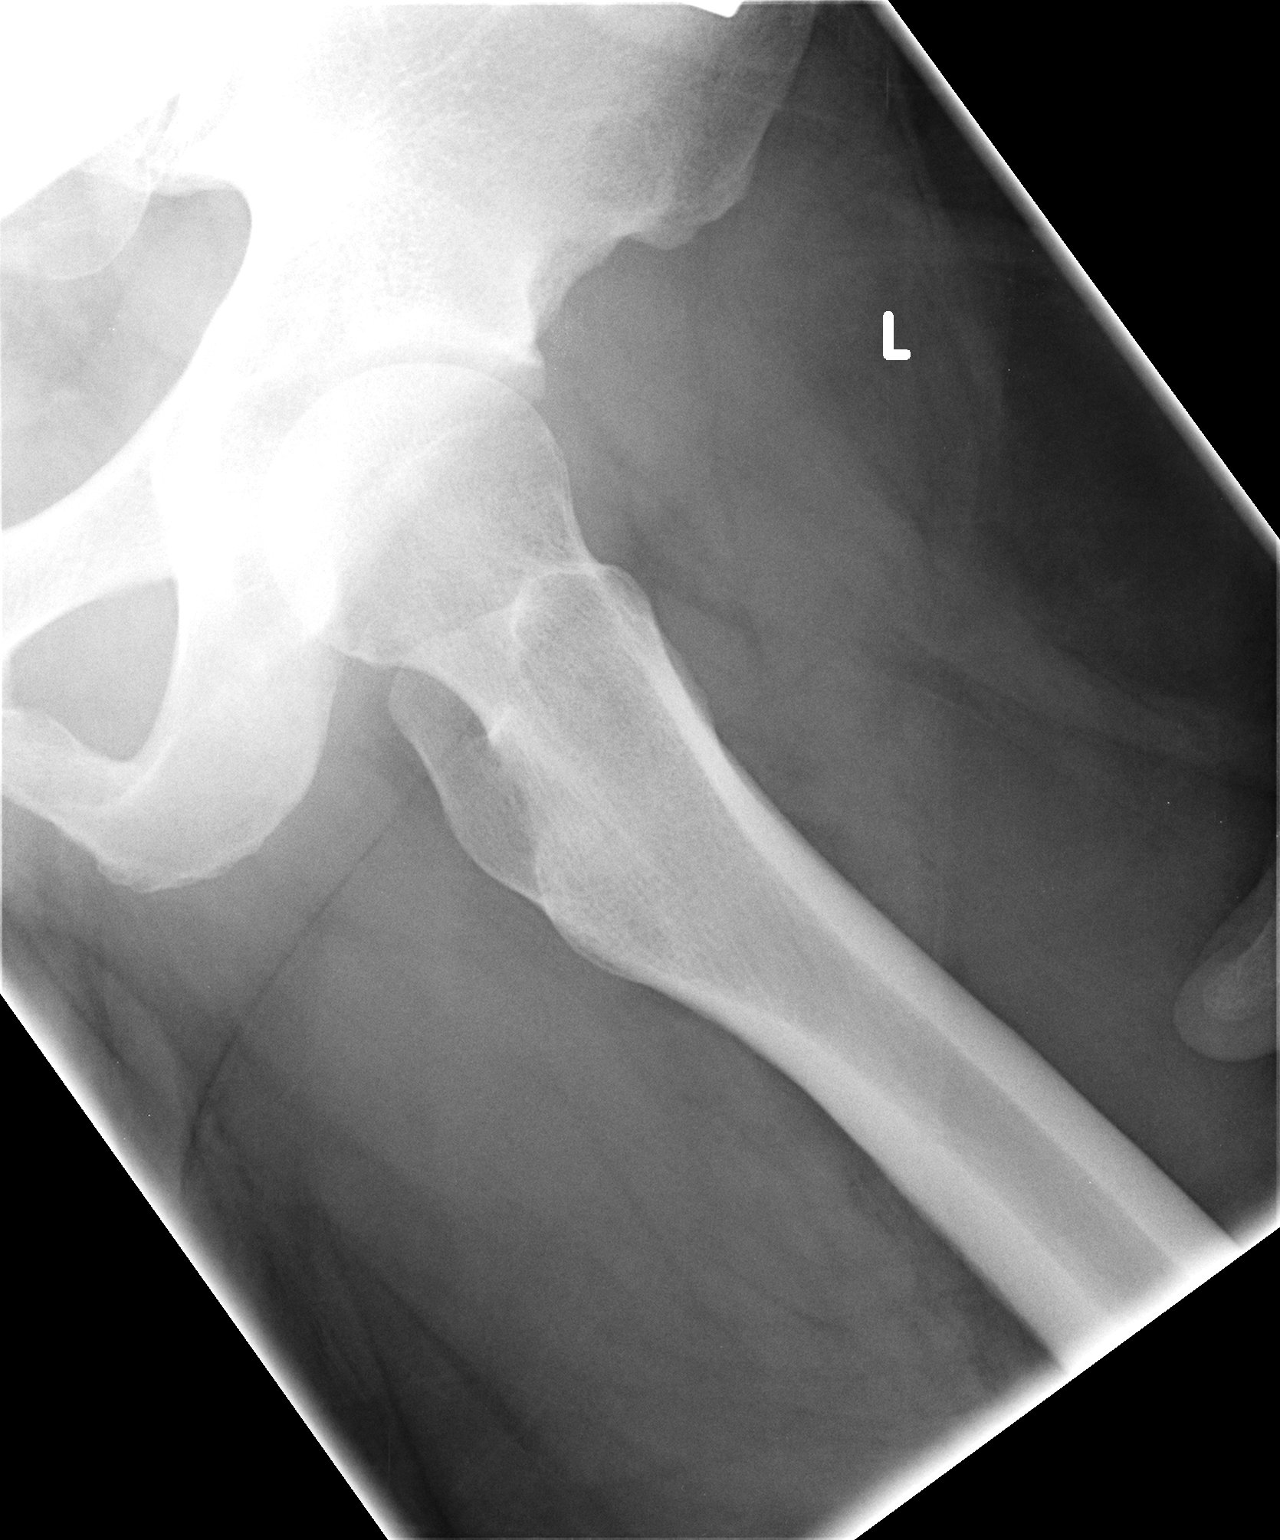

[3 of 3 positions shown; findings below may reference images not displayed]

FINDINGS: Three views of the left hip submitted. No acute fracture or
subluxation. Bilateral hip joints are symmetrical in appearance.
Bilateral SI joints are unremarkable.
IMPRESSION: Negative.

## 2015-09-14 ENCOUNTER — Encounter: Payer: Self-pay | Admitting: Emergency Medicine

## 2015-09-14 ENCOUNTER — Emergency Department
Admission: EM | Admit: 2015-09-14 | Disposition: A | Discharge status: Home / Self Care | Payer: Self-pay | Source: Ambulatory Visit | Attending: Emergency Medicine | Admitting: Emergency Medicine

## 2015-09-14 LAB — HM HIV SCREENING OFFERED

## 2015-09-14 MED ORDER — BUPIVACAINE HCL 0.5 % IJ SOLUTION *WRAPPED*
5.0000 mL | Freq: Once | INTRAMUSCULAR | Status: AC
Start: 2015-09-14 — End: 2015-09-14

## 2015-09-14 MED ORDER — OXYCODONE-ACETAMINOPHEN 5-325 MG PO TABS *I*
1.0000 | ORAL_TABLET | Freq: Four times a day (QID) | ORAL | 0 refills | Status: AC | PRN
Start: 2015-09-14 — End: ?

## 2015-09-14 MED ORDER — OXYCODONE-ACETAMINOPHEN 5-325 MG PO TABS *I*
1.0000 | ORAL_TABLET | Freq: Four times a day (QID) | ORAL | 0 refills | Status: DC | PRN
Start: 2015-09-14 — End: 2015-09-14

## 2015-09-14 MED ORDER — AMOXICILLIN 500 MG PO CAPS *I*
500.0000 mg | ORAL_CAPSULE | Freq: Two times a day (BID) | ORAL | 0 refills | Status: AC
Start: 2015-09-14 — End: 2015-09-21

## 2015-09-14 MED ORDER — LIDOCAINE VISCOUS 2 % MT SOLN *I*
5.0000 mL | Freq: Once | OROMUCOSAL | Status: AC
Start: 2015-09-14 — End: 2015-09-14
  Administered 2015-09-14: 5 mL via OROMUCOSAL
  Filled 2015-09-14: qty 100

## 2015-09-14 MED ORDER — BUPIVACAINE HCL 0.5 % IJ SOLUTION *WRAPPED*
INTRAMUSCULAR | Status: AC
Start: 2015-09-14 — End: 2015-09-14
  Administered 2015-09-14: 5 mL via DENTAL
  Filled 2015-09-14: qty 10

## 2015-09-14 MED ORDER — AMOXICILLIN 500 MG PO CAPS *I*
500.0000 mg | ORAL_CAPSULE | Freq: Once | ORAL | Status: AC
Start: 2015-09-14 — End: 2015-09-14
  Administered 2015-09-14: 500 mg via ORAL
  Filled 2015-09-14: qty 1

## 2015-09-14 NOTE — ED Notes (Signed)
ED RN INTERN ATTESTATION       I Almon RegisterKelly Damare Serano, RN (RN) reviewed the following charting information by the RN intern: Efraim KaufmannMelissa Fischer    Nursing Assessments  Medications  Plan of Care  Teaching   Notes    In the chart of Gary Horne [age] y.o. male) and attest to the charting being accurate.

## 2015-09-14 NOTE — ED Notes (Signed)
Patient presents to the ED with right sided dental pain, which the patient states is his "bottom right wisdom tooth".  Per patient, he chipped his tooth "last week" and used "temperin max about five hours ago" to "temporarily fill" the chipped tooth.  Patient currently reports 8/10 throbbing pain in his bottom tooth.

## 2015-09-14 NOTE — ED Triage Notes (Signed)
Broke tooth lower right        Triage Note   Charlett LangoJohn Lyal Husted, RN

## 2015-09-14 NOTE — ED Provider Notes (Addendum)
History     Chief Complaint   Patient presents with    Dental Problem     HPI Comments: Gary Horne is a 29 y.o. Male who presents to the emergency department with complaints of right lower dental pain in the setting of a recent broken tooth.  To mitigate this injury the patient recently place an over-the-counter temporary filling agent.  Since then, his he has complained of increased pain.  Pain is moderate to severe. Sharp ache.  Local to the right lower jaw.  No radiation.  Denies associated symptoms of fever, shortness of breath, difficulty breathing, difficulty swallowing.        History provided by:  Patient    History reviewed. No pertinent past medical history.     History reviewed. No pertinent past surgical history.  No family history on file.    Social History    reports that he has never smoked. He does not have any smokeless tobacco history on file. He reports that he does not drink alcohol or use illicit drugs. His sexual activity history is not on file.  Living Situation     Questions Responses    Patient lives with     Homeless     Caregiver for other family member     External Services     Employment     Domestic Violence Risk           Problem List   There is no problem list on file for this patient.      Review of Systems   Review of Systems   Constitutional: Negative for fever.   HENT: Positive for dental problem. Negative for facial swelling, sore throat and trouble swallowing.    Musculoskeletal: Negative for neck pain.   Neurological: Negative for headaches.       Physical Exam       ED Triage Vitals   BP Heart Rate Heart Rate (via Pulse Ox) Resp Temp Temp src SpO2 O2 Device O2 Flow Rate   09/14/15 0554 09/14/15 0554 -- 09/14/15 0554 09/14/15 0554 09/14/15 0554 09/14/15 0554 09/14/15 0554 --   160/90 62  16 35.9 C (96.6 F) TEMPORAL 96 % None (Room air)       Weight           09/14/15 0554           108.9 kg (240 lb)             Physical Exam   Constitutional: He appears  well-developed and well-nourished.   Appears uncomfortable   HENT:   Head: Normocephalic and atraumatic.   Right Ear: Hearing and external ear normal.   Left Ear: Hearing and external ear normal.   Nose: Nose normal.   Mouth/Throat: Uvula is midline. Dental caries present.   Right lower posterior-most molars with fracture/decay and packing/cement material in place.  Normal jaw opening.  OP clear.  No stridor, neck swelling, redness the face or neck.     Eyes: Conjunctivae are normal.   Neck: Normal range of motion. Neck supple.   Cardiovascular: Regular rhythm.    Pulmonary/Chest: Effort normal. No stridor.   Neurological: He is alert. GCS eye subscore is 4. GCS verbal subscore is 5. GCS motor subscore is 6.   Psychiatric: Judgment normal. Cognition and memory are normal.   2    Medical Decision Making      Initial Evaluation:  ED First Provider Contact     None  Patient seen by me today 09/14/2015 at approx 0600    Assessment:  29 y.o.male comes to the ED with dental pain in the setting of a recent fracture to hands over-the-counter treatment.  Patient without airway issues.  No fever.  Physical exam is consistent with history.    Differential Diagnosis includes fractured tooth, dental decay, apical abscess         Plan: Dental block, by mouth antibiotics, prescription for pain medicine, dental follow-up.    Coral Else, MD           Coral Else, MD  09/14/15 1610       Coral Else, MD  09/14/15 [phone]

## 2015-09-14 NOTE — ED Notes (Signed)
Patient's discharge instructions, follow up care, prescription, electronic prescription, medication, and pain management discussed.  Prescription given.  Patient verbalizes understanding.  Patient's ID band removed.

## 2015-09-14 NOTE — Discharge Instructions (Signed)
You were seen here in the Emergency Department for tooth pain. An exam did not show any dangerous findings and you were felt safe to go home.     You were provided with a prescription for antibiotics which you should take as prescribed. You may take ibuprofen 600mg  every 8 hours as needed for pain. You were provided with a prescription for additional pain medication (percocet) which you may take as needed. You should not drive or operate heavy machine while taking this medication. Do not combine this medication with any other medicine containing acetaminophen.     If you do not have a dentist or are unable to get an appointment [facility] [phone])  Please call your primary care doctor to schedule a follow up appointment.    If you have fever >101F, notice swelling of your gum, redness on your skin, pus draining from your tooth, or your symptoms persist or worsen please call your primary care doctor or return to the emergency department.

## 2020-10-29 ENCOUNTER — Emergency Department (HOSPITAL_COMMUNITY): Payer: Medicaid - Out of State

## 2020-10-29 ENCOUNTER — Emergency Department (HOSPITAL_COMMUNITY)
Admission: EM | Admit: 2020-10-29 | Discharge: 2020-10-29 | Disposition: A | Discharge status: Home / Self Care | Payer: Medicaid - Out of State | Attending: Emergency Medicine | Admitting: Emergency Medicine

## 2020-10-29 ENCOUNTER — Encounter (HOSPITAL_COMMUNITY): Payer: Self-pay | Admitting: Emergency Medicine

## 2020-10-29 ENCOUNTER — Other Ambulatory Visit: Payer: Self-pay

## 2020-10-29 DIAGNOSIS — Z794 Long term (current) use of insulin: Secondary | ICD-10-CM | POA: Diagnosis not present

## 2020-10-29 DIAGNOSIS — K910 Vomiting following gastrointestinal surgery: Secondary | ICD-10-CM | POA: Insufficient documentation

## 2020-10-29 DIAGNOSIS — R0602 Shortness of breath: Secondary | ICD-10-CM | POA: Diagnosis not present

## 2020-10-29 DIAGNOSIS — Z20822 Contact with and (suspected) exposure to covid-19: Secondary | ICD-10-CM | POA: Insufficient documentation

## 2020-10-29 DIAGNOSIS — R059 Cough, unspecified: Secondary | ICD-10-CM | POA: Diagnosis not present

## 2020-10-29 DIAGNOSIS — R531 Weakness: Secondary | ICD-10-CM | POA: Diagnosis not present

## 2020-10-29 DIAGNOSIS — Z7984 Long term (current) use of oral hypoglycemic drugs: Secondary | ICD-10-CM | POA: Insufficient documentation

## 2020-10-29 DIAGNOSIS — J069 Acute upper respiratory infection, unspecified: Secondary | ICD-10-CM

## 2020-10-29 DIAGNOSIS — Z79899 Other long term (current) drug therapy: Secondary | ICD-10-CM | POA: Diagnosis not present

## 2020-10-29 LAB — CBC WITH DIFFERENTIAL/PLATELET
Abs Immature Granulocytes: 0.01 10*3/uL (ref 0.00–0.07)
Basophils Absolute: 0 10*3/uL (ref 0.0–0.1)
Basophils Relative: 0 %
Eosinophils Absolute: 0.1 10*3/uL (ref 0.0–0.5)
Eosinophils Relative: 2 %
HCT: 44.6 % (ref 39.0–52.0)
Hemoglobin: 14.5 g/dL (ref 13.0–17.0)
Immature Granulocytes: 0 %
Lymphocytes Relative: 48 %
Lymphs Abs: 2.5 10*3/uL (ref 0.7–4.0)
MCH: 27.2 pg (ref 26.0–34.0)
MCHC: 32.5 g/dL (ref 30.0–36.0)
MCV: 83.5 fL (ref 80.0–100.0)
Monocytes Absolute: 0.5 10*3/uL (ref 0.1–1.0)
Monocytes Relative: 9 %
Neutro Abs: 2.1 10*3/uL (ref 1.7–7.7)
Neutrophils Relative %: 41 %
Platelets: 265 10*3/uL (ref 150–400)
RBC: 5.34 MIL/uL (ref 4.22–5.81)
RDW: 13.2 % (ref 11.5–15.5)
WBC: 5.2 10*3/uL (ref 4.0–10.5)
nRBC: 0 % (ref 0.0–0.2)

## 2020-10-29 LAB — COMPREHENSIVE METABOLIC PANEL
ALT: 28 U/L (ref 0–44)
AST: 18 U/L (ref 15–41)
Albumin: 3.8 g/dL (ref 3.5–5.0)
Alkaline Phosphatase: 60 U/L (ref 38–126)
Anion gap: 7 (ref 5–15)
BUN: 12 mg/dL (ref 6–20)
CO2: 28 mmol/L (ref 22–32)
Calcium: 9.3 mg/dL (ref 8.9–10.3)
Chloride: 104 mmol/L (ref 98–111)
Creatinine, Ser: 1 mg/dL (ref 0.61–1.24)
GFR, Estimated: >60 mL/min (ref >60)
Glucose, Bld: 163 mg/dL — ABNORMAL HIGH (ref 70–99)
Potassium: 3.8 mmol/L (ref 3.5–5.1)
Sodium: 139 mmol/L (ref 135–145)
Total Bilirubin: 0.7 mg/dL (ref 0.3–1.2)
Total Protein: 7 g/dL (ref 6.5–8.1)

## 2020-10-29 LAB — RESP PANEL BY RT-PCR (FLU A&B, COVID) ARPGX2
Influenza A by PCR: NEGATIVE
Influenza B by PCR: NEGATIVE
SARS Coronavirus 2 by RT PCR: NEGATIVE

## 2020-10-29 LAB — D-DIMER, QUANTITATIVE: D-Dimer, Quant: 0.28 ug/mL-FEU (ref 0.00–0.50)

## 2020-10-29 MED ORDER — BENZONATATE 100 MG PO CAPS: 100.0000 mg | ORAL_CAPSULE | Freq: Three times a day (TID) | ORAL | 0 refills | Status: AC

## 2020-10-29 MED ORDER — ALBUTEROL SULFATE HFA 108 (90 BASE) MCG/ACT IN AERS
2.0000 | INHALATION_SPRAY | Freq: Once | RESPIRATORY_TRACT | Status: AC
  Administered 2020-10-29: 2 via RESPIRATORY_TRACT
  Filled 2020-10-29: qty 6.7

## 2020-10-29 MED ORDER — BENZONATATE 100 MG PO CAPS
100.0000 mg | ORAL_CAPSULE | Freq: Once | ORAL | Status: AC
  Administered 2020-10-29: 100 mg via ORAL
  Filled 2020-10-29: qty 1

## 2020-10-29 MED ORDER — AEROCHAMBER Z-STAT PLUS/MEDIUM MISC: 1.0000 | Freq: Once | Status: DC

## 2020-10-29 NOTE — ED Triage Notes (Signed)
Pt c/o sob and cough that started last Monday. Pt states hes been around family who have covid.

## 2020-10-29 NOTE — Discharge Instructions (Signed)
At this time there does not appear to be the presence of an emergent medical condition, however there is always the potential for conditions to change. Please read and follow the below instructions.  Please return to the Emergency Department immediately for any new or worsening symptoms or if your symptoms do not improve within 3 days. Please be sure to follow up with your Primary Care Provider within one week regarding your visit today; please call their office to schedule an appointment even if you are feeling better for a follow-up visit. You may use the albuterol inhaler given and she did day to help with your symptoms.  If you feel your albuterol is not helping with your cough and breathing then please return to the ER immediately for reevaluation.  You have also been prescribed Tessalon to help with your cough.  Please continue to drink water to avoid dehydration and get plenty of rest.  Go to the nearest Emergency Department immediately if: You have fever or chills You have chest pain or difficulty breathing You cough up blood You have abdominal pain or unable to keep down food/water due to vomiting You have very bad or constant: Headache. Ear pain. Pain in your forehead, behind your eyes, and over your cheekbones (sinus pain). You have long-lasting (chronic) lung disease along with any of these: Wheezing. Long-lasting cough. Coughing up blood. A change in your usual mucus. You have a stiff neck. You have changes in your: Vision. Hearing. Thinking. Mood. You have any new/concerning or worsening of symptoms  Please read the additional information packets attached to your discharge summary.  Do not take your medicine if  develop an itchy rash, swelling in your mouth or lips, or difficulty breathing; call 911 and seek immediate emergency medical attention if this occurs.  You may review your lab tests and imaging results in their entirety on your MyChart account.  Please discuss  all results of fully with your primary care provider and other specialist at your follow-up visit.  Note: Portions of this text may have been transcribed using voice recognition software. Every effort was made to ensure accuracy; however, inadvertent computerized transcription errors may still be present.

## 2020-10-29 NOTE — ED Provider Notes (Signed)
[facility] EMERGENCY DEPARTMENT Provider Note   CSN: [id number] Arrival date & time: 10/29/20  1716     History Chief Complaint  Patient presents with  . Shortness of Breath    Jason Stone is a 35 y.o. male.  HPI 35 year old male who presents to the ER with cough, shortness of breath, frequent posttussive emesis and weakness for approximately a week.  States he was around his brother who tested positive for Covid approximately a week ago.  He states that he had Covid at the beginning of the pandemic, states he wanted to renal failure and had to be hospitalized.  States that his Covid tests were all negative but his markers were elevated which were suggestive of Covid.  He states he went to the [place] ER several days ago and had a Covid test there which was all negative.  He states he had some lab work done and a chest x-ray which were all okay.  He states however his symptoms are continuing to get worse as he has progressively more short of breath.  History reviewed. No pertinent past medical history.  Patient Active Problem List   Diagnosis Date Noted  . Intramuscular lipoma   . 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 APPENDECTOMY    . CHOLECYSTECTOMY    . MASS EXCISION Left 10/13/2014   Procedure: EXCISION MASS;  Surgeon: Vickki Hearing, MD;  Location: [facility];  Service: Orthopedics;  Laterality: Left;  left thigh       Family History  Problem Relation Age of Onset  . Cancer - Lung Other   . Cancer - Other Other     Social History   Tobacco Use  . Smoking status: Never Smoker  Substance Use Topics  . Alcohol use: No  . Drug use: No    Home Medications Prior to Admission medications   Medication Sig Start Date End Date Taking? Authorizing Provider  amLODipine (NORVASC) 10 MG tablet Take by mouth. 08/05/19  Yes [provider]  Cholecalciferol 50 MCG (2000 UT) TABS Take by mouth. 04/25/20   Yes [provider]  FLUoxetine (PROZAC) 20 MG capsule Take by mouth. 10/12/19  Yes [provider]  insulin glargine (LANTUS) 100 UNIT/ML injection Inject into the skin. 08/05/19  Yes [provider]  liraglutide (VICTOZA) 18 MG/3ML SOPN Inject into the skin. 04/26/20  Yes [provider]  losartan (COZAAR) 25 MG tablet  11/07/19  Yes [provider]  meloxicam (MOBIC) 7.5 MG tablet  07/31/20  Yes [provider]  metoprolol tartrate (LOPRESSOR) 25 MG tablet Take by mouth. 03/14/20  Yes [provider]  ondansetron (ZOFRAN) 4 MG tablet Take by mouth. 06/13/20  Yes [provider]  pantoprazole (PROTONIX) 20 MG tablet  08/05/16  Yes [provider]  pregabalin (LYRICA) 150 MG capsule Take by mouth. 03/14/20  Yes [provider]  promethazine (PHENERGAN) 12.5 MG tablet  09/09/16  Yes [provider]  ranitidine (ZANTAC) 150 MG tablet  08/05/16  Yes [provider]  rosuvastatin (CRESTOR) 10 MG tablet  11/07/19  Yes [provider]  senna-docusate (SENOKOT-S) 8.6-50 MG tablet Take by mouth. 04/25/20  Yes [provider]  traMADol Janean Sark) 50 MG tablet  08/31/20  Yes [provider]  acetaminophen (TYLENOL) 500 MG tablet Take 500 mg by mouth every 6 (six) hours as needed for moderate pain.     [provider]  acetaminophen-codeine (TYLENOL #3) 300-30 MG per tablet Take 1 tablet by mouth every 6 (six) hours as needed for moderate pain. 12/27/14   Carole Civil, MD  amitriptyline (ELAVIL) 50 MG tablet Take 50 mg by mouth at bedtime. 07/31/20   [provider]  ascorbic acid (VITAMIN C) 500 MG tablet Take by mouth.    [provider]  fluticasone (FLONASE) 50 MCG/ACT nasal spray Place into the nose.    [provider]  HUMALOG KWIKPEN 100 UNIT/ML KwikPen Inject into the skin 3 (three) times daily. 10/26/20   [provider]   ibuprofen (ADVIL,MOTRIN) 800 MG tablet Take 1 tablet (800 mg total) by mouth 3 (three) times daily. 12/27/14   Carole Civil, MD  metFORMIN (GLUCOPHAGE) 1000 MG tablet Take by mouth.    [provider]  methylPREDNISolone (MEDROL DOSEPAK) 4 MG TBPK tablet Take by mouth. 08/01/20   [provider]  Oxycodone HCl 10 MG TABS Take by mouth. 08/01/20   [provider]    Allergies    Patient has no known allergies.  Review of Systems   Review of Systems  Constitutional: Negative for chills and fever.  HENT: Negative for ear pain and sore throat.   Eyes: Negative for pain and visual disturbance.  Respiratory: Positive for cough and shortness of breath.   Cardiovascular: Negative for chest pain and palpitations.  Gastrointestinal: Positive for vomiting. Negative for abdominal pain.  Genitourinary: Negative for dysuria and hematuria.  Musculoskeletal: Negative for arthralgias and back pain.  Skin: Negative for color change and rash.  Neurological: Negative for seizures and syncope.  All other systems reviewed and are negative.   Physical Exam Updated Vital Signs BP (!) 141/92 (BP Location: Right Arm)   Pulse 90   Temp 98.1 F (36.7 C)   Resp 20   Ht 5\' 11"  (1.803 m)   Wt 106.1 kg   SpO2 99%   BMI 32.64 kg/m   Physical Exam Vitals and nursing note reviewed.  Constitutional:      Appearance: He is well-developed and well-nourished. He is obese.  HENT:     Head: Normocephalic and atraumatic.  Eyes:     Conjunctiva/sclera: Conjunctivae normal.  Cardiovascular:     Rate and Rhythm: Normal rate and regular rhythm.     Heart sounds: No murmur heard.   Pulmonary:     Effort: Accessory muscle usage present. No respiratory distress.     Breath sounds: Examination of the right-upper field reveals decreased breath sounds. Examination of the left-upper field reveals decreased breath sounds. Examination of the right-lower field reveals decreased breath  sounds. Examination of the left-lower field reveals decreased breath sounds. Decreased breath sounds present.  Chest:     Chest wall: No tenderness.  Abdominal:     Palpations: Abdomen is soft.     Tenderness: There is no abdominal tenderness.  Musculoskeletal:        General: No edema.     Cervical back: Neck supple.     Right lower leg: No edema.     Left lower leg: No edema.  Skin:    General: Skin is warm and dry.  Neurological:     General: No focal deficit present.     Mental Status: He is alert.  Psychiatric:        Mood and Affect: Mood and affect and mood normal.        Behavior: Behavior normal.     ED Results / Procedures /  Treatments   Labs (all labs ordered are listed, but only abnormal results are displayed) Labs Reviewed  RESP PANEL BY RT-PCR (FLU A&B, COVID) ARPGX2  CBC WITH DIFFERENTIAL/PLATELET  COMPREHENSIVE METABOLIC PANEL  D-DIMER, QUANTITATIVE (NOT AT [facility])    EKG None  Radiology DG Chest Portable 1 View  Result Date: 10/29/2020 CLINICAL DATA:  35 year old male with cough. EXAM: PORTABLE CHEST 1 VIEW COMPARISON:  None. FINDINGS: No focal consolidation, pleural effusion, pneumothorax. The cardiac silhouette is within limits. No acute osseous pathology. IMPRESSION: No active disease. Electronically Signed   By: Anner Crete M.D.   On: 10/29/2020 17:53    Procedures Procedures (including critical care time)  Medications Ordered in ED Medications - No data to display  ED Course  I have reviewed the triage vital signs and the nursing notes.  Pertinent labs & imaging results that were available during my care of the patient were reviewed by me and considered in my medical decision making (see chart for details).    MDM Rules/Calculators/A&P                          35 year old male with cough and worsening shortness of breath over the last week.  Vitals on arrival, afebrile, not hypoxic, tachycardic.  He is fairly tachypneic on my exam with a  rate between 25 and 28 breaths/min.  Abdomen is soft and nontender.  Patient reports that he has had Covid before and had to be hospitalized due to renal failure and hypoxia (no records of this in our system) and reports a visit to the [place] ED several days ago and states he had a negative Covid test.   Plan for basic labs to rule out any complications from his illness, Covid test, chest x-ray, D-dimer for rule out of PE, EKG. Nursing will ambulate w/ pulse ox   Signed out to [facility] who will oversee the rest of his workup and dispo accordingly.    Final Clinical Impression(s) / ED Diagnoses Final diagnoses:  None    Rx / DC Orders ED Discharge Orders    None       Garald Balding, PA-C 10/29/20 1815    Sherwood Gambler, MD 11/01/20 [phone]

## 2020-10-29 NOTE — ED Notes (Signed)
Pt ambulated around room. O2 sats 97-100% on RA

## 2020-10-29 NOTE — ED Provider Notes (Signed)
Care handoff received from Sharyn Lull PA-C at shift change please see previous providers note for full details of visit.  In short 35 year old male presented with cough and shortness of breath x1 week.  Patient also with some posttussive emesis without abdominal pain.  Noted to be tachypneic and tachycardic but no hypoxia or fever.  Patient was concerned for possible Covid infection and reports hospitalization in 2020 secondary to Covid.  Labs ordered including CBC, CMP, D-dimer, Covid test along with a chest x-ray.  I have been asked to follow-up on those results, pending no acute findings anticipate discharge. Physical Exam  BP 135/89   Pulse 70   Temp 98.1 F (36.7 C)   Resp 18   Ht 5\' 11"  (1.803 m)   Wt 106.1 kg   SpO2 99%   BMI 32.64 kg/m   Physical Exam Constitutional:      General: He is not in acute distress.    Appearance: Normal appearance. He is well-developed. He is not ill-appearing or diaphoretic.  HENT:     Head: Normocephalic and atraumatic.  Eyes:     General: Vision grossly intact. Gaze aligned appropriately.     Pupils: Pupils are equal, round, and reactive to light.  Neck:     Trachea: Trachea and phonation normal.  Cardiovascular:     Rate and Rhythm: Normal rate and regular rhythm.  Pulmonary:     Effort: Pulmonary effort is normal. No accessory muscle usage or respiratory distress.     Breath sounds: Normal breath sounds.  Abdominal:     General: There is no distension.     Palpations: Abdomen is soft.     Tenderness: There is no abdominal tenderness. There is no guarding or rebound.  Musculoskeletal:        General: Normal range of motion.     Cervical back: Normal range of motion.     Right lower leg: No tenderness. No edema.     Left lower leg: No tenderness. No edema.  Skin:    General: Skin is warm and dry.  Neurological:     Mental Status: He is alert.     GCS: GCS eye subscore is 4. GCS verbal subscore is 5. GCS motor subscore is 6.      Comments: Speech is clear and goal oriented, follows commands Major Cranial nerves without deficit, no facial droop Moves extremities without ataxia, coordination intact  Psychiatric:        Behavior: Behavior normal.     ED Course/Procedures     Procedures  MDM  I ordered, reviewed and interpreted labs which include: CBC within normal limits, no leukocytosis to suggest bacterial infection, no anemia. D-dimer within normal limits, low suspicion for pulmonary embolism. CMP shows 163 otherwise within normal limits.  No emergent electrolyte derangement, AKI, LFT elevations or gap.  Bicarb within normal limits.  No evidence for DKA. Covid/influenza panel negative. CXR:  IMPRESSION:  No active disease.   EKG: Normal sinus rhythm Rightward axis Nonspecific T wave abnormality Abnormal ECG No old tracing to compare Confirmed by Sherwood Gambler [phone]) on 10/29/2020 7:48:40 PM ------------ 7:50 PM: Patient reassessed he is resting comfortably in bed no acute distress vital signs within normal limits.  He reports that he is feeling well, updated patient on findings above and he states understanding.  Patient's history and presentation is consistent with viral URI with cough.  There is no evidence of bacterial pneumonia or other infection requiring antibiotics at this time.  Patient  encouraged to maintain water hydration, plenty of rest and isolation and to follow-up with his PCP this week for recheck.  Patient was ambulated by nursing staff without hypoxia on room air.  Patient reports that he felt well while ambulating.  Will prescribe patient Tessalon to help with his cough.  Albuterol inhaler plus spacer given to the patient in ER to help with symptoms.  On cardiopulmonary exam lung sounds clear bilaterally.  Abdomen is soft nontender without peritoneal signs, doubt appendicitis, diverticulitis, SBO, perforation or other emergent intra-abdominal pathologies.  Additionally patient is a type II  diabetic but does not appear to be in DKA, labs today reassuring and patient reports that he monitors his sugars regularly.  Patient neurovascular tact to all 4 extremities without evidence of DVT.  Low suspicion for pulmonary embolism no indication for CT imaging or further work-up at this time  At this time there does not appear to be any evidence of an acute emergency medical condition and the patient appears stable for discharge with appropriate outpatient follow up. Diagnosis was discussed with patient who verbalizes understanding of care plan and is agreeable to discharge. I have discussed return precautions with patient who verbalizes understanding. Patient encouraged to follow-up with their PCP. All questions answered.  Patient's case discussed with Dr. Criss Alvine who agrees with plan to discharge with albuterol and tessalon follow-up.   Sajid Lohmann was evaluated in Emergency Department on 10/29/2020 for the symptoms described in the history of present illness. He was evaluated in the context of the global COVID-19 pandemic, which necessitated consideration that the patient might be at risk for infection with the SARS-CoV-2 virus that causes COVID-19. Institutional protocols and algorithms that pertain to the evaluation of patients at risk for COVID-19 are in a state of rapid change based on information released by regulatory bodies including the CDC and federal and state organizations. These policies and algorithms were followed during the patient's care in the ED.  Note: Portions of this report may have been transcribed using voice recognition software. Every effort was made to ensure accuracy; however, inadvertent computerized transcription errors may still be present.   Elizabeth Palau 10/29/20 Nicholes Calamity, MD 11/01/20 [phone]

## 2021-08-17 IMAGING — DX DG CHEST 1V PORT
1 series · 1 of 1 positions shown · non-contrast
Comparison: None.

CLINICAL DATA: 34-year-old male with cough.

EXAM:
PORTABLE CHEST 1 VIEW

[chest ap]
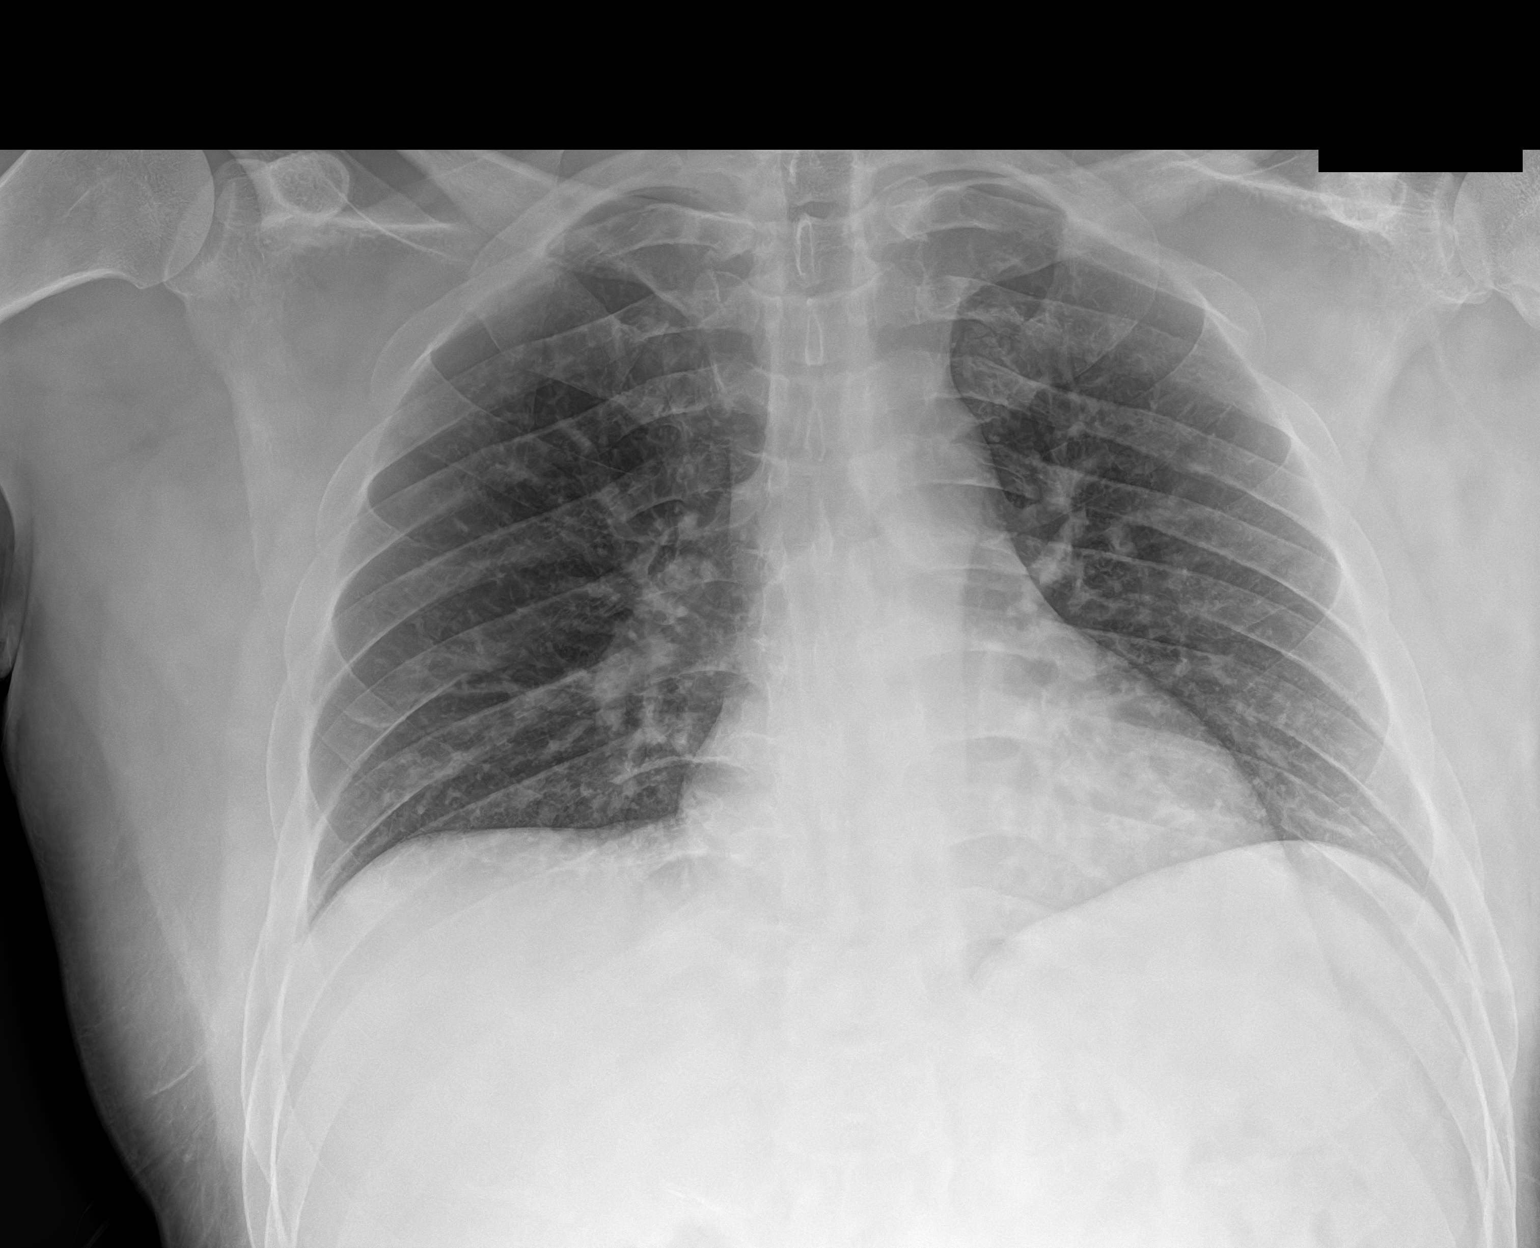

[1 of 1 positions shown; findings below may reference images not displayed]

FINDINGS: No focal consolidation, pleural effusion, pneumothorax. The cardiac
silhouette is within limits. No acute osseous pathology.
IMPRESSION: No active disease.

## 2022-06-23 ENCOUNTER — Emergency Department (HOSPITAL_COMMUNITY): Payer: Medicaid - Out of State

## 2022-06-23 ENCOUNTER — Encounter (HOSPITAL_COMMUNITY): Payer: Self-pay

## 2022-06-23 ENCOUNTER — Emergency Department (HOSPITAL_COMMUNITY)
Admission: EM | Admit: 2022-06-23 | Discharge: 2022-06-23 | Disposition: A | Discharge status: Home / Self Care | Payer: Medicaid - Out of State | Attending: Emergency Medicine | Admitting: Emergency Medicine

## 2022-06-23 ENCOUNTER — Other Ambulatory Visit: Payer: Self-pay

## 2022-06-23 DIAGNOSIS — R519 Headache, unspecified: Secondary | ICD-10-CM | POA: Diagnosis present

## 2022-06-23 DIAGNOSIS — M546 Pain in thoracic spine: Secondary | ICD-10-CM | POA: Insufficient documentation

## 2022-06-23 DIAGNOSIS — Z79899 Other long term (current) drug therapy: Secondary | ICD-10-CM | POA: Diagnosis not present

## 2022-06-23 DIAGNOSIS — R209 Unspecified disturbances of skin sensation: Secondary | ICD-10-CM | POA: Insufficient documentation

## 2022-06-23 DIAGNOSIS — Z794 Long term (current) use of insulin: Secondary | ICD-10-CM | POA: Diagnosis not present

## 2022-06-23 DIAGNOSIS — E119 Type 2 diabetes mellitus without complications: Secondary | ICD-10-CM | POA: Diagnosis not present

## 2022-06-23 DIAGNOSIS — Z7984 Long term (current) use of oral hypoglycemic drugs: Secondary | ICD-10-CM | POA: Insufficient documentation

## 2022-06-23 HISTORY — DX: Type 2 diabetes mellitus without complications: E11.9

## 2022-06-23 HISTORY — DX: Malignant (primary) neoplasm, unspecified: C80.1

## 2022-06-23 LAB — URINALYSIS, ROUTINE W REFLEX MICROSCOPIC
Bacteria, UA: NONE SEEN
Bilirubin Urine: NEGATIVE
Glucose, UA: >=500 mg/dL — AB
Hgb urine dipstick: NEGATIVE
Ketones, ur: 20 mg/dL — AB
Leukocytes,Ua: NEGATIVE
Nitrite: NEGATIVE
Protein, ur: NEGATIVE mg/dL
Specific Gravity, Urine: 1.03 (ref 1.005–1.030)
pH: 5 (ref 5.0–8.0)

## 2022-06-23 LAB — CBC WITH DIFFERENTIAL/PLATELET
Abs Immature Granulocytes: 0 10*3/uL (ref 0.00–0.07)
Basophils Absolute: 0 10*3/uL (ref 0.0–0.1)
Basophils Relative: 1 %
Eosinophils Absolute: 0.1 10*3/uL (ref 0.0–0.5)
Eosinophils Relative: 3 %
HCT: 48 % (ref 39.0–52.0)
Hemoglobin: 16 g/dL (ref 13.0–17.0)
Immature Granulocytes: 0 %
Lymphocytes Relative: 44 %
Lymphs Abs: 1.9 10*3/uL (ref 0.7–4.0)
MCH: 27.3 pg (ref 26.0–34.0)
MCHC: 33.3 g/dL (ref 30.0–36.0)
MCV: 81.9 fL (ref 80.0–100.0)
Monocytes Absolute: 0.4 10*3/uL (ref 0.1–1.0)
Monocytes Relative: 8 %
Neutro Abs: 1.9 10*3/uL (ref 1.7–7.7)
Neutrophils Relative %: 44 %
Platelets: 241 10*3/uL (ref 150–400)
RBC: 5.86 MIL/uL — ABNORMAL HIGH (ref 4.22–5.81)
RDW: 13.6 % (ref 11.5–15.5)
WBC: 4.3 10*3/uL (ref 4.0–10.5)
nRBC: 0 % (ref 0.0–0.2)

## 2022-06-23 LAB — BASIC METABOLIC PANEL
Anion gap: 9 (ref 5–15)
BUN: 16 mg/dL (ref 6–20)
CO2: 25 mmol/L (ref 22–32)
Calcium: 9.1 mg/dL (ref 8.9–10.3)
Chloride: 102 mmol/L (ref 98–111)
Creatinine, Ser: 1.3 mg/dL — ABNORMAL HIGH (ref 0.61–1.24)
GFR, Estimated: >60 mL/min (ref >60)
Glucose, Bld: 277 mg/dL — ABNORMAL HIGH (ref 70–99)
Potassium: 3.9 mmol/L (ref 3.5–5.1)
Sodium: 136 mmol/L (ref 135–145)

## 2022-06-23 MED ORDER — HYDROCODONE-ACETAMINOPHEN 5-325 MG PO TABS: 1.0000 | ORAL_TABLET | Freq: Four times a day (QID) | ORAL | 0 refills | Status: AC | PRN

## 2022-06-23 MED ORDER — KETOROLAC TROMETHAMINE 30 MG/ML IJ SOLN
15.0000 mg | Freq: Once | INTRAMUSCULAR | Status: AC
  Administered 2022-06-23: 15 mg via INTRAVENOUS
  Filled 2022-06-23: qty 1

## 2022-06-23 MED ORDER — DEXAMETHASONE SODIUM PHOSPHATE 10 MG/ML IJ SOLN
10.0000 mg | Freq: Once | INTRAMUSCULAR | Status: AC
  Administered 2022-06-23: 10 mg via INTRAVENOUS
  Filled 2022-06-23: qty 1

## 2022-06-23 MED ORDER — HYDROCODONE-ACETAMINOPHEN 5-325 MG PO TABS
1.0000 | ORAL_TABLET | Freq: Once | ORAL | Status: AC
  Administered 2022-06-23: 1 via ORAL
  Filled 2022-06-23: qty 1

## 2022-06-23 MED ORDER — DIPHENHYDRAMINE HCL 50 MG/ML IJ SOLN
12.5000 mg | Freq: Once | INTRAMUSCULAR | Status: AC
Start: 2022-06-23 — End: 2022-06-23
  Administered 2022-06-23: 12.5 mg via INTRAVENOUS
  Filled 2022-06-23: qty 1

## 2022-06-23 MED ORDER — PROCHLORPERAZINE EDISYLATE 10 MG/2ML IJ SOLN
10.0000 mg | Freq: Once | INTRAMUSCULAR | Status: AC
  Administered 2022-06-23: 10 mg via INTRAVENOUS
  Filled 2022-06-23: qty 2

## 2022-06-23 NOTE — Discharge Instructions (Addendum)
Your lab tests and imaging today are reassuring.  Follow-up with Dr. Izora Ribas if your back pain persists.  Do not drive within 4 hours of taking the hydrocodone you have been prescribed as this medication will make you drowsy.

## 2022-06-23 NOTE — ED Provider Notes (Signed)
[facility] EMERGENCY DEPARTMENT Provider Note   CSN: [id number] Arrival date & time: 06/23/22  1911     History {Add pertinent medical, surgical, social history, OB history to HPI:1} Chief Complaint  Patient presents with   Back Pain   Headache    Jason Stone is a 36 y.o. 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  His pain started suddenly when he was simply sitting at home 3 days ago doing paperwork.  He also endorses photophobia, nausea without emesis and has had several episodes of diarrhea daily for the past 3 days.  He denies weakness in his legs, but does have chronic numbness in the left leg which has been present for at least the past year.  He has noted a sensation of incomplete bladder emptying for the past week.  He denies fevers or chills.  He denies neck rigidity, denies any recent injuries or falls.  He has had no medications for symptoms prior to arrival.  The history is provided by the patient and a relative (Mother at bedside).       Home Medications Prior to Admission medications   Medication Sig Start Date End Date Taking? Authorizing Provider  HYDROcodone-acetaminophen (NORCO/VICODIN) 5-325 MG tablet Take 1 tablet by mouth every 6 (six) hours as needed. 06/23/22  Yes Eustacia Urbanek, Almyra Free, PA-C  acetaminophen (TYLENOL) 500 MG tablet Take 500 mg by mouth every 6 (six) hours as needed for moderate pain.     [provider]  amitriptyline (ELAVIL) 50 MG tablet Take 50 mg by mouth at bedtime. 07/31/20   [provider]  amLODipine (NORVASC) 10 MG tablet Take by mouth. 08/05/19    [provider]  ascorbic acid (VITAMIN C) 500 MG tablet Take by mouth.    [provider]  benzonatate (TESSALON) 100 MG capsule Take 1 capsule (100 mg total) by mouth every 8 (eight) hours. 10/29/20   Deliah Boston, PA-C  Cholecalciferol 50 MCG (2000 UT) TABS Take by mouth. 04/25/20   [provider]  FLUoxetine (PROZAC) 20 MG capsule Take by mouth. 10/12/19   [provider]  fluticasone (FLONASE) 50 MCG/ACT nasal spray Place into the nose.    [provider]  HUMALOG KWIKPEN 100 UNIT/ML KwikPen Inject into the skin 3 (three) times daily. 10/26/20   [provider]  ibuprofen (ADVIL,MOTRIN) 800 MG tablet Take 1 tablet (800 mg total) by mouth 3 (three) times daily. 12/27/14   Carole Civil, MD  insulin glargine (LANTUS) 100 UNIT/ML injection Inject into the skin. 08/05/19   [provider]  liraglutide (VICTOZA) 18 MG/3ML SOPN Inject into the skin. 04/26/20   [provider]  losartan (COZAAR) 25 MG tablet  11/07/19   [provider]  meloxicam (MOBIC) 7.5 MG tablet  07/31/20   [provider]  metFORMIN (GLUCOPHAGE) 1000 MG tablet Take by mouth.    [provider]  methylPREDNISolone (MEDROL DOSEPAK) 4 MG TBPK tablet Take by mouth. 08/01/20   [provider]  metoprolol tartrate (LOPRESSOR) 25 MG tablet Take by mouth. 03/14/20   [provider]  ondansetron (ZOFRAN) 4 MG tablet  Take by mouth. 06/13/20   [provider]  Oxycodone HCl 10 MG TABS Take by mouth. 08/01/20   [provider]  pantoprazole (PROTONIX) 20 MG tablet  08/05/16   [provider]  pregabalin (LYRICA) 150 MG capsule Take by mouth. 03/14/20   [provider]  promethazine (PHENERGAN) 12.5 MG tablet  09/09/16   [provider]  ranitidine (ZANTAC) 150 MG tablet  08/05/16   [provider]  rosuvastatin (CRESTOR) 10 MG tablet  11/07/19   [provider]   senna-docusate (SENOKOT-S) 8.6-50 MG tablet Take by mouth. 04/25/20   [provider]      Allergies    Duloxetine hcl    Review of Systems   Review of Systems  Constitutional:  Negative for chills and fever.  HENT:  Negative for congestion and sore throat.   Eyes:  Positive for photophobia. Negative for visual disturbance.  Respiratory:  Negative for chest tightness and shortness of breath.   Cardiovascular:  Negative for chest pain.  Gastrointestinal:  Positive for diarrhea and nausea. Negative for abdominal pain.  Genitourinary:  Positive for difficulty urinating.  Musculoskeletal:  Positive for back pain. Negative for arthralgias, joint swelling and neck pain.  Skin: Negative.  Negative for rash and wound.  Neurological:  Positive for headaches. Negative for dizziness, weakness, light-headedness and numbness.  Psychiatric/Behavioral: Negative.      Physical Exam Updated Vital Signs BP (!) 143/97 (BP Location: Left Arm)   Pulse 60   Temp 98.2 F (36.8 C) (Oral)   Resp 15   Ht 6' (1.829 m)   Wt 104.8 kg   SpO2 98%   BMI 31.33 kg/m  Physical Exam Vitals and nursing note reviewed.  Constitutional:      General: He is not in acute distress.    Appearance: He is well-developed.  HENT:     Head: Normocephalic.  Eyes:     Extraocular Movements: Extraocular movements intact.     Conjunctiva/sclera: Conjunctivae normal.     Pupils: Pupils are equal, round, and reactive to light.  Cardiovascular:     Rate and Rhythm: Normal rate.     Pulses: Normal pulses.     Comments: Pedal pulses normal. Pulmonary:     Effort: Pulmonary effort is normal.  Abdominal:     General: Bowel sounds are normal. There is no distension.     Palpations: Abdomen is soft. There is no mass.  Musculoskeletal:        General: Normal range of motion.     Cervical back: Normal range of motion and neck supple. No rigidity.     Thoracic back: Bony tenderness present.     Lumbar back: No  swelling, edema, spasms or tenderness.     Comments: Ttp mid thoracic spine with no edema or palpable deformity.   Lymphadenopathy:     Cervical: No cervical adenopathy.  Skin:    General: Skin is warm and dry.  Neurological:     Mental Status: He is alert.     Cranial Nerves: Cranial nerves 2-12 are intact. No facial asymmetry.     Sensory: Sensory deficit present.     Motor: Motor function is intact. No tremor, atrophy or pronator drift.     Coordination: Coordination is intact.     Gait: Gait normal.     Deep Tendon Reflexes:     Reflex Scores:      Patellar reflexes are 2+ on the right side and 2+ on the  left side.    Comments: No strength deficit noted in hip and knee flexor and extensor muscle groups.  Ankle flexion and extension intact.  Decreased sensation to fine touch in the left foot and lower extremity, not localized to a specific dermatome.  Patient states this has been chronic times 1+ years.     ED Results / Procedures / Treatments   Labs (all labs ordered are listed, but only abnormal results are displayed) Labs Reviewed  CBC WITH DIFFERENTIAL/PLATELET - Abnormal; Notable for the following components:      Result Value   RBC 5.86 (*)    All other components within normal limits  BASIC METABOLIC PANEL - Abnormal; Notable for the following components:   Glucose, Bld 277 (*)    Creatinine, Ser 1.30 (*)    All other components within normal limits  URINALYSIS, ROUTINE W REFLEX MICROSCOPIC - Abnormal; Notable for the following components:   Color, Urine STRAW (*)    Glucose, UA >=500 (*)    Ketones, ur 20 (*)    All other components within normal limits    EKG None  Radiology CT Thoracic Spine Wo Contrast  Result Date: 06/23/2022 CLINICAL DATA:  Initial evaluation for mid back pain. EXAM: CT THORACIC SPINE WITHOUT CONTRAST TECHNIQUE: Multidetector CT images of the thoracic were obtained using the standard protocol without intravenous contrast. RADIATION DOSE  REDUCTION: This exam was performed according to the departmental dose-optimization program which includes automated exposure control, adjustment of the mA and/or kV according to patient size and/or use of iterative reconstruction technique. COMPARISON:  None Available. FINDINGS: Alignment: Mild sigmoid scoliosis with straightening of the normal thoracic kyphosis. No listhesis. Vertebrae: Vertebral body height maintained without acute or chronic fracture. Visualized ribs intact. No discrete or worrisome osseous lesions. Paraspinal and other soft tissues: Paraspinous soft tissues within normal limits. Disc levels: No significant disc pathology visible by CT. No appreciable spinal stenosis. Foramina appear patent. IMPRESSION: 1. No acute osseous abnormality within the thoracic spine. 2. Mild sigmoid scoliosis with straightening of the normal thoracic kyphosis. Electronically Signed   By: Jeannine Boga M.D.   On: 06/23/2022 22:22   CT Head Wo Contrast  Result Date: 06/23/2022 CLINICAL DATA:  Headache new or worsening, neuro deficit. EXAM: CT HEAD WITHOUT CONTRAST TECHNIQUE: Contiguous axial images were obtained from the base of the skull through the vertex without intravenous contrast. RADIATION DOSE REDUCTION: This exam was performed according to the departmental dose-optimization program which includes automated exposure control, adjustment of the mA and/or kV according to patient size and/or use of iterative reconstruction technique. COMPARISON:  None Available. FINDINGS: Brain: No evidence of acute infarction, hemorrhage, hydrocephalus, extra-axial collection or mass lesion/mass effect. Vascular: No hyperdense vessel or unexpected calcification. Skull: Normal. Negative for fracture or focal lesion. Sinuses/Orbits: Patchy opacification of the ethmoid air cells. Remaining paranasal sinuses and mastoid air cells are clear Other: None. IMPRESSION: No acute intracranial abnormality. Electronically Signed   By:  Keane Police D.O.   On: 06/23/2022 22:19    Procedures Procedures  {Document cardiac monitor, telemetry assessment procedure when appropriate:1}  Medications Ordered in ED Medications  prochlorperazine (COMPAZINE) injection 10 mg (10 mg Intravenous Given 06/23/22 2222)  diphenhydrAMINE (BENADRYL) injection 12.5 mg (12.5 mg Intravenous Given 06/23/22 2221)  dexamethasone (DECADRON) injection 10 mg (10 mg Intravenous Given 06/23/22 2222)  ketorolac (TORADOL) 30 MG/ML injection 15 mg (15 mg Intravenous Given 06/23/22 2341)  HYDROcodone-acetaminophen (NORCO/VICODIN) 5-325 MG per tablet 1 tablet (1 tablet Oral Given  06/23/22 2343)    ED Course/ Medical Decision Making/ A&P Clinical Course as of 06/23/22 2353  Mon Jun 23, 2022  2319 Patient received migraine cocktail including Compazine, Benadryl and Decadron.  His headache was completely resolved, his thoracic pain is now improved, 6 out of 10 currently.  Added Toradol IV and hydrocodone p.o.  Pending urinalysis at this time, also will obtain a bladder scan as patient reports sensation of incomplete bladder emptying. [JI]    Clinical Course User Index [JI] Evalee Jefferson, PA-C                           Medical Decision Making Patient with a 3-day history of left-sided headache, photophobia and mid thoracic pain with radiation into his bilateral lower extremities.  Past history is significant for prior life of sarcoma of the left thigh muscle undergoing every 6 month surveillance with his oncologist in [place].  Has also been seen by [facility], most recently April 2022 with a mid thoracic suspected intradural cyst.  Today's imaging including head CT and thoracic CT are reassuring with no intracranial or thoracic findings.  Amount and/or Complexity of Data Reviewed Labs: ordered.    Details: Be met and CBC are reassuring, he does have a blood glucose of 277, patient is diabetic.  There is no anion gap. Radiology: ordered.    Details: CT head and thoracic  spine negative for acute emergent or surgical findings.  Risk Prescription drug management.     {Document critical care time when appropriate:1} {Document review of labs and clinical decision tools ie heart score, Chads2Vasc2 etc:1}  {Document your independent review of radiology images, and any outside records:1} {Document your discussion with family members, caretakers, and with consultants:1} {Document social determinants of health affecting pt's care:1} {Document your decision making why or why not admission, treatments were needed:1} Final Clinical Impression(s) / ED Diagnoses Final diagnoses:  Nonintractable headache, unspecified chronicity pattern, unspecified headache type  Acute midline thoracic back pain    Rx / DC Orders ED Discharge Orders          Ordered    HYDROcodone-acetaminophen (NORCO/VICODIN) 5-325 MG tablet  Every 6 hours PRN        06/23/22 2353

## 2022-06-23 NOTE — ED Provider Notes (Incomplete)
[facility] EMERGENCY DEPARTMENT Provider Note   CSN: [id number] Arrival date & time: 06/23/22  1911     History {Add pertinent medical, surgical, social history, OB history to HPI:1} Chief Complaint  Patient presents with  . Back Pain  . Headache    Jason Stone is a 36 y.o. 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  His pain started suddenly when he was simply sitting at home 3 days ago doing paperwork.  He also endorses photophobia, nausea without emesis and has had several episodes of diarrhea daily for the past 3 days.  He denies weakness in his legs, but does have chronic numbness in the left leg which has been present for at least the past year.  He has noted a sensation of incomplete bladder emptying for the past week.  He denies fevers or chills.  He denies neck rigidity, denies any recent injuries or falls.  He has had no medications for symptoms prior to arrival.  The history is provided by the patient and a relative (Mother at bedside).       Home Medications Prior to Admission medications   Medication Sig Start Date End Date Taking? Authorizing Provider  HYDROcodone-acetaminophen (NORCO/VICODIN) 5-325 MG tablet Take 1 tablet by mouth every 6 (six) hours as needed. 06/23/22  Yes Nakeysha Pasqual, Almyra Free, PA-C  acetaminophen (TYLENOL) 500 MG tablet Take 500 mg by mouth every 6 (six) hours as needed for moderate pain.     [provider]  amitriptyline (ELAVIL) 50 MG tablet Take 50 mg by mouth at bedtime. 07/31/20   [provider]  amLODipine (NORVASC) 10 MG tablet Take by mouth. 08/05/19    [provider]  ascorbic acid (VITAMIN C) 500 MG tablet Take by mouth.    [provider]  benzonatate (TESSALON) 100 MG capsule Take 1 capsule (100 mg total) by mouth every 8 (eight) hours. 10/29/20   Deliah Boston, PA-C  Cholecalciferol 50 MCG (2000 UT) TABS Take by mouth. 04/25/20   [provider]  FLUoxetine (PROZAC) 20 MG capsule Take by mouth. 10/12/19   [provider]  fluticasone (FLONASE) 50 MCG/ACT nasal spray Place into the nose.    [provider]  HUMALOG KWIKPEN 100 UNIT/ML KwikPen Inject into the skin 3 (three) times daily. 10/26/20   [provider]  ibuprofen (ADVIL,MOTRIN) 800 MG tablet Take 1 tablet (800 mg total) by mouth 3 (three) times daily. 12/27/14   Carole Civil, MD  insulin glargine (LANTUS) 100 UNIT/ML injection Inject into the skin. 08/05/19   [provider]  liraglutide (VICTOZA) 18 MG/3ML SOPN Inject into the skin. 04/26/20   [provider]  losartan (COZAAR) 25 MG tablet  11/07/19   [provider]  meloxicam (MOBIC) 7.5 MG tablet  07/31/20   [provider]  metFORMIN (GLUCOPHAGE) 1000 MG tablet Take by mouth.    [provider]  methylPREDNISolone (MEDROL DOSEPAK) 4 MG TBPK tablet Take by mouth. 08/01/20   [provider]  metoprolol tartrate (LOPRESSOR) 25 MG tablet Take by mouth. 03/14/20   [provider]  ondansetron (ZOFRAN) 4 MG tablet  Take by mouth. 06/13/20   [provider]  Oxycodone HCl 10 MG TABS Take by mouth. 08/01/20   [provider]  pantoprazole (PROTONIX) 20 MG tablet  08/05/16   [provider]  pregabalin (LYRICA) 150 MG capsule Take by mouth. 03/14/20   [provider]  promethazine (PHENERGAN) 12.5 MG tablet  09/09/16   [provider]  ranitidine (ZANTAC) 150 MG tablet  08/05/16   [provider]  rosuvastatin (CRESTOR) 10 MG tablet  11/07/19   [provider]   senna-docusate (SENOKOT-S) 8.6-50 MG tablet Take by mouth. 04/25/20   [provider]      Allergies    Duloxetine hcl    Review of Systems   Review of Systems  Constitutional:  Negative for chills and fever.  HENT:  Negative for congestion and sore throat.   Eyes:  Positive for photophobia. Negative for visual disturbance.  Respiratory:  Negative for chest tightness and shortness of breath.   Cardiovascular:  Negative for chest pain.  Gastrointestinal:  Positive for diarrhea and nausea. Negative for abdominal pain.  Genitourinary:  Positive for difficulty urinating.  Musculoskeletal:  Positive for back pain. Negative for arthralgias, joint swelling and neck pain.  Skin: Negative.  Negative for rash and wound.  Neurological:  Positive for headaches. Negative for dizziness, weakness, light-headedness and numbness.  Psychiatric/Behavioral: Negative.      Physical Exam Updated Vital Signs BP (!) 143/97 (BP Location: Left Arm)   Pulse 60   Temp 98.2 F (36.8 C) (Oral)   Resp 15   Ht 6' (1.829 m)   Wt 104.8 kg   SpO2 98%   BMI 31.33 kg/m  Physical Exam Vitals and nursing note reviewed.  Constitutional:      General: He is not in acute distress.    Appearance: He is well-developed.  HENT:     Head: Normocephalic.  Eyes:     Extraocular Movements: Extraocular movements intact.     Conjunctiva/sclera: Conjunctivae normal.     Pupils: Pupils are equal, round, and reactive to light.  Cardiovascular:     Rate and Rhythm: Normal rate.     Pulses: Normal pulses.     Comments: Pedal pulses normal. Pulmonary:     Effort: Pulmonary effort is normal.  Abdominal:     General: Bowel sounds are normal. There is no distension.     Palpations: Abdomen is soft. There is no mass.  Musculoskeletal:        General: Normal range of motion.     Cervical back: Normal range of motion and neck supple. No rigidity.     Thoracic back: Bony tenderness present.     Lumbar back: No  swelling, edema, spasms or tenderness.     Comments: Ttp mid thoracic spine with no edema or palpable deformity.   Lymphadenopathy:     Cervical: No cervical adenopathy.  Skin:    General: Skin is warm and dry.  Neurological:     Mental Status: He is alert.     Cranial Nerves: Cranial nerves 2-12 are intact. No facial asymmetry.     Sensory: Sensory deficit present.     Motor: Motor function is intact. No tremor, atrophy or pronator drift.     Coordination: Coordination is intact.     Gait: Gait normal.     Deep Tendon Reflexes:     Reflex Scores:      Patellar reflexes are 2+ on the right side and 2+ on the  left side.    Comments: No strength deficit noted in hip and knee flexor and extensor muscle groups.  Ankle flexion and extension intact.  Decreased sensation to fine touch in the left foot and lower extremity, not localized to a specific dermatome.  Patient states this has been chronic times 1+ years.     ED Results / Procedures / Treatments   Labs (all labs ordered are listed, but only abnormal results are displayed) Labs Reviewed  CBC WITH DIFFERENTIAL/PLATELET - Abnormal; Notable for the following components:      Result Value   RBC 5.86 (*)    All other components within normal limits  BASIC METABOLIC PANEL - Abnormal; Notable for the following components:   Glucose, Bld 277 (*)    Creatinine, Ser 1.30 (*)    All other components within normal limits  URINALYSIS, ROUTINE W REFLEX MICROSCOPIC - Abnormal; Notable for the following components:   Color, Urine STRAW (*)    Glucose, UA >=500 (*)    Ketones, ur 20 (*)    All other components within normal limits    EKG None  Radiology CT Thoracic Spine Wo Contrast  Result Date: 06/23/2022 CLINICAL DATA:  Initial evaluation for mid back pain. EXAM: CT THORACIC SPINE WITHOUT CONTRAST TECHNIQUE: Multidetector CT images of the thoracic were obtained using the standard protocol without intravenous contrast. RADIATION DOSE  REDUCTION: This exam was performed according to the departmental dose-optimization program which includes automated exposure control, adjustment of the mA and/or kV according to patient size and/or use of iterative reconstruction technique. COMPARISON:  None Available. FINDINGS: Alignment: Mild sigmoid scoliosis with straightening of the normal thoracic kyphosis. No listhesis. Vertebrae: Vertebral body height maintained without acute or chronic fracture. Visualized ribs intact. No discrete or worrisome osseous lesions. Paraspinal and other soft tissues: Paraspinous soft tissues within normal limits. Disc levels: No significant disc pathology visible by CT. No appreciable spinal stenosis. Foramina appear patent. IMPRESSION: 1. No acute osseous abnormality within the thoracic spine. 2. Mild sigmoid scoliosis with straightening of the normal thoracic kyphosis. Electronically Signed   By: Jeannine Boga M.D.   On: 06/23/2022 22:22   CT Head Wo Contrast  Result Date: 06/23/2022 CLINICAL DATA:  Headache new or worsening, neuro deficit. EXAM: CT HEAD WITHOUT CONTRAST TECHNIQUE: Contiguous axial images were obtained from the base of the skull through the vertex without intravenous contrast. RADIATION DOSE REDUCTION: This exam was performed according to the departmental dose-optimization program which includes automated exposure control, adjustment of the mA and/or kV according to patient size and/or use of iterative reconstruction technique. COMPARISON:  None Available. FINDINGS: Brain: No evidence of acute infarction, hemorrhage, hydrocephalus, extra-axial collection or mass lesion/mass effect. Vascular: No hyperdense vessel or unexpected calcification. Skull: Normal. Negative for fracture or focal lesion. Sinuses/Orbits: Patchy opacification of the ethmoid air cells. Remaining paranasal sinuses and mastoid air cells are clear Other: None. IMPRESSION: No acute intracranial abnormality. Electronically Signed   By:  Keane Police D.O.   On: 06/23/2022 22:19    Procedures Procedures  {Document cardiac monitor, telemetry assessment procedure when appropriate:1}  Medications Ordered in ED Medications  prochlorperazine (COMPAZINE) injection 10 mg (10 mg Intravenous Given 06/23/22 2222)  diphenhydrAMINE (BENADRYL) injection 12.5 mg (12.5 mg Intravenous Given 06/23/22 2221)  dexamethasone (DECADRON) injection 10 mg (10 mg Intravenous Given 06/23/22 2222)  ketorolac (TORADOL) 30 MG/ML injection 15 mg (15 mg Intravenous Given 06/23/22 2341)  HYDROcodone-acetaminophen (NORCO/VICODIN) 5-325 MG per tablet 1 tablet (1 tablet Oral Given  06/23/22 2343)    ED Course/ Medical Decision Making/ A&P Clinical Course as of 06/23/22 2353  Mon Jun 23, 2022  2319 Patient received migraine cocktail including Compazine, Benadryl and Decadron.  His headache was completely resolved, his thoracic pain is now improved, 6 out of 10 currently.  Added Toradol IV and hydrocodone p.o.  Pending urinalysis at this time, also will obtain a bladder scan as patient reports sensation of incomplete bladder emptying. [JI]    Clinical Course User Index [JI] Evalee Jefferson, PA-C                           Medical Decision Making Patient with a 3-day history of left-sided headache, photophobia and mid thoracic pain with radiation into his bilateral lower extremities.  Past history is significant for prior life of sarcoma of the left thigh muscle undergoing every 6 month surveillance with his oncologist in [place].  Has also been seen by [facility], most recently April 2022 with a mid thoracic suspected intradural cyst.  Today's imaging including head CT and thoracic CT are reassuring with no intracranial or thoracic findings.  Amount and/or Complexity of Data Reviewed Labs: ordered.    Details: Be met and CBC are reassuring, he does have a blood glucose of 277, patient is diabetic.  There is no anion gap. Radiology: ordered.    Details: CT head and thoracic  spine negative for acute emergent or surgical findings.  Risk Prescription drug management.     {Document critical care time when appropriate:1} {Document review of labs and clinical decision tools ie heart score, Chads2Vasc2 etc:1}  {Document your independent review of radiology images, and any outside records:1} {Document your discussion with family members, caretakers, and with consultants:1} {Document social determinants of health affecting pt's care:1} {Document your decision making why or why not admission, treatments were needed:1} Final Clinical Impression(s) / ED Diagnoses Final diagnoses:  Nonintractable headache, unspecified chronicity pattern, unspecified headache type  Acute midline thoracic back pain    Rx / DC Orders ED Discharge Orders          Ordered    HYDROcodone-acetaminophen (NORCO/VICODIN) 5-325 MG tablet  Every 6 hours PRN        06/23/22 2353

## 2022-06-23 NOTE — ED Triage Notes (Signed)
Pov from home cc of back pain and headache for 3 days. . Pt says that he back pain is mid back and there is pain going down his left leg. States that the headache is on the left side as well.

## 2022-06-23 NOTE — ED Notes (Signed)
Bladder scan 192

## 8387-06-28 DEATH — deceased
